# Patient Record
Sex: Male | Born: 1955 | Race: White | Hispanic: No | Marital: Married | Smoking: Former smoker
Health system: Southern US, Community
[De-identification: ages and names within clinical notes are randomized; demographics above are authoritative.]

## PROBLEM LIST (undated history)

## (undated) DIAGNOSIS — N200 Calculus of kidney: Secondary | ICD-10-CM

## (undated) DIAGNOSIS — E785 Hyperlipidemia, unspecified: Secondary | ICD-10-CM

## (undated) DIAGNOSIS — I2510 Atherosclerotic heart disease of native coronary artery without angina pectoris: Secondary | ICD-10-CM

## (undated) DIAGNOSIS — I10 Essential (primary) hypertension: Secondary | ICD-10-CM

## (undated) HISTORY — DX: Atherosclerotic heart disease of native coronary artery without angina pectoris: I25.10

## (undated) HISTORY — DX: Essential (primary) hypertension: I10

## (undated) HISTORY — DX: Hyperlipidemia, unspecified: E78.5

## (undated) HISTORY — DX: Calculus of kidney: N20.0

## (undated) HISTORY — PX: COLONOSCOPY: SHX174

---

## 2002-02-27 LAB — CONVERTED CEMR LAB
HDL: 52 mg/dL
LDL Cholesterol: 87 mg/dL
Triglycerides: 80 mg/dL

## 2006-11-22 ENCOUNTER — Ambulatory Visit: Payer: Self-pay | Admitting: Family Medicine

## 2006-11-22 DIAGNOSIS — R030 Elevated blood-pressure reading, without diagnosis of hypertension: Secondary | ICD-10-CM | POA: Insufficient documentation

## 2006-11-23 ENCOUNTER — Encounter: Payer: Self-pay | Admitting: Family Medicine

## 2006-11-26 ENCOUNTER — Encounter: Payer: Self-pay | Admitting: Family Medicine

## 2006-11-26 LAB — CONVERTED CEMR LAB
BUN: 20 mg/dL (ref 6–23)
Chloride: 104 meq/L (ref 96–112)
Cholesterol: 172 mg/dL (ref 0–200)
HDL: 44 mg/dL (ref >39)
LDL Cholesterol: 112 mg/dL — ABNORMAL HIGH (ref 0–99)
Potassium: 4.3 meq/L (ref 3.5–5.3)
Triglycerides: 79 mg/dL (ref <150)
VLDL: 16 mg/dL (ref 0–40)

## 2009-06-18 ENCOUNTER — Encounter: Payer: Self-pay | Admitting: Family Medicine

## 2009-09-25 DIAGNOSIS — I219 Acute myocardial infarction, unspecified: Secondary | ICD-10-CM

## 2009-09-25 HISTORY — PX: CARDIAC CATHETERIZATION: SHX172

## 2009-09-25 HISTORY — DX: Acute myocardial infarction, unspecified: I21.9

## 2010-07-19 ENCOUNTER — Telehealth (INDEPENDENT_AMBULATORY_CARE_PROVIDER_SITE_OTHER): Payer: Self-pay | Admitting: *Deleted

## 2010-07-19 ENCOUNTER — Ambulatory Visit: Payer: Self-pay | Admitting: Family Medicine

## 2010-07-19 DIAGNOSIS — I10 Essential (primary) hypertension: Secondary | ICD-10-CM | POA: Insufficient documentation

## 2010-07-19 LAB — CONVERTED CEMR LAB
ALT: 14 units/L
AST: 16 units/L
Alkaline Phosphatase: 54 units/L
Cholesterol: 200 mg/dL
GFR calc Af Amer: 99 mL/min
GFR calc non Af Amer: 85 mL/min
LDL Cholesterol: 126 mg/dL
Total CHOL/HDL Ratio: 3.9
Triglycerides: 116 mg/dL

## 2010-07-20 ENCOUNTER — Telehealth: Payer: Self-pay | Admitting: Family Medicine

## 2010-07-20 DIAGNOSIS — I2510 Atherosclerotic heart disease of native coronary artery without angina pectoris: Secondary | ICD-10-CM | POA: Insufficient documentation

## 2010-07-25 ENCOUNTER — Encounter: Payer: Self-pay | Admitting: Family Medicine

## 2010-07-25 ENCOUNTER — Telehealth: Payer: Self-pay | Admitting: Family Medicine

## 2010-07-25 LAB — CONVERTED CEMR LAB: ALT: 15 units/L

## 2010-07-26 ENCOUNTER — Encounter: Payer: Self-pay | Admitting: Family Medicine

## 2010-07-28 ENCOUNTER — Encounter: Payer: Self-pay | Admitting: Family Medicine

## 2010-07-28 LAB — CONVERTED CEMR LAB
Albumin: 4.8 g/dL
Calcium: 9.6 mg/dL

## 2010-08-16 ENCOUNTER — Encounter: Payer: Self-pay | Admitting: Family Medicine

## 2010-09-08 ENCOUNTER — Encounter: Payer: Self-pay | Admitting: Family Medicine

## 2010-09-08 DIAGNOSIS — I252 Old myocardial infarction: Secondary | ICD-10-CM

## 2010-09-14 ENCOUNTER — Ambulatory Visit: Payer: Self-pay | Admitting: Family Medicine

## 2010-09-16 ENCOUNTER — Encounter: Payer: Self-pay | Admitting: Family Medicine

## 2010-09-16 LAB — CONVERTED CEMR LAB
LDL Cholesterol: 58 mg/dL
Total CHOL/HDL Ratio: 58

## 2010-10-05 ENCOUNTER — Encounter: Payer: Self-pay | Admitting: Family Medicine

## 2010-10-25 NOTE — Progress Notes (Signed)
Summary: Lab results  Phone Note Outgoing Call   Summary of Call: Call pt: glucose in teh pre-diabetic range. We will follow and recheck in 6 months. Thyroid and prostate blood test are normal. Liver and kidneys are great. Chol is up a little. LDL is 126 (should be under 100). Work on diet and exercise. Recheck in one year.  Initial call taken by: Nani Gasser MD,  July 25, 2010 12:01 PM  Follow-up for Phone Call        called and notified pt of results and reccomendations.FYI: pt is at the ER now because he was having chest pain,nausea and sweating.Will get D/C summary from [facility] in a few days Follow-up by: Avon Gully CMA, Duncan Dull),  July 25, 2010 2:34 PM

## 2010-10-25 NOTE — Assessment & Plan Note (Signed)
Summary: CPE, HTN   Vital Signs:  Patient profile:   55 year old male Height:      71.5 inches Weight:      191 pounds BMI:     26.36 Pulse rate:   98 / minute BP sitting:   142 / 92  (right arm) Cuff size:   regular  Vitals Entered By: Avon Gully CMA, Duncan Dull) (July 19, 2010 9:42 AM) CC: CPE   Primary Care Provider:  Linford Arnold, C  CC:  CPE.  History of Present Illness: Has noticed if More HA over the last 10 years.  Using uses BC goody powder. Sees that his BP is high and wonders if this could be related.  + fam hx of heart dz and of HTN.   Current Medications (verified): 1)  None  Allergies (verified): No Known Drug Allergies  Comments:  Nurse/Medical Assistant: The patient's medications and allergies were reviewed with the patient and were updated in the Medication and Allergy Lists. 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  Married to [employer] with 2 adult children.    Former Smoker Alcohol use-no Drug use-no Regular exercise-no  Review of Systems       The patient complains of chest pain.  The patient denies anorexia, fever, weight loss, weight gain, vision loss, decreased hearing, hoarseness, syncope, dyspnea on exertion, peripheral edema, prolonged cough, headaches, hemoptysis, abdominal pain, melena, hematochezia, severe indigestion/heartburn, hematuria, incontinence, genital sores, muscle weakness, suspicious skin lesions, transient blindness, difficulty walking, depression, unusual weight change, abnormal bleeding, enlarged lymph nodes, and breast masses.         Says get occ twinge in his chest. Can be anywhere and last for seconds. It is random and not particularly bohtersome. No assoc with SOB , etc. Can be at rest or acitivity.    Physical Exam  General:  Well-developed,well-nourished,in no acute distress; alert,appropriate and cooperative throughout examination Head:  Normocephalic and atraumatic without obvious abnormalities. No apparent alopecia or balding. Eyes:  No corneal or conjunctival inflammation noted. EOMI. Perrla.  Ears:  External ear exam shows no significant lesions or deformities.  Otoscopic examination reveals clear canals, tympanic membranes are intact bilaterally without bulging, retraction, inflammation or discharge. Hearing is grossly normal bilaterally. Nose:  External nasal examination shows no deformity or inflammation. Nasal mucosa are pink and moist without lesions or exudates. Mouth:  Oral mucosa and oropharynx without lesions or exudates.  Teeth in good repair. Neck:  No deformities, masses, or tenderness noted. Chest Wall:  No deformities, masses, tenderness or gynecomastia noted. Lungs:  Normal respiratory effort, chest expands symmetrically. Lungs are clear to auscultation, no crackles or wheezes. Heart:  Normal rate and regular rhythm. S1 and S2 normal without gallop, murmur, click, rub or other extra sounds. Abdomen:  Bowel sounds positive,abdomen soft and non-tender without masses, organomegaly or hernias noted. Rectal:  no external abnormalities, no hemorrhoids, normal sphincter tone, and no masses.   Prostate:  no nodules, no asymmetry, and 1+ enlarged.   Msk:  No deformity or scoliosis noted of thoracic or lumbar spine.   Pulses:  R and L carotid,radial,dorsalis pedis and posterior tibial pulses are full and equal bilaterally Extremities:  No clubbing, cyanosis, edema, or deformity noted with normal full range of motion of all joints.  Neurologic:  No cranial nerve deficits noted. Station and gait are normal.DTRs are symmetrical throughout. Sensory, motor and coordinative functions appear intact. Skin:  no rashes.   Cervical Nodes:  No lymphadenopathy noted Psych:  Cognition and  judgment appear intact. Alert and cooperative with normal attention span and concentration. No apparent delusions, illusions, hallucinations   Impression & Recommendations:  Problem # 1:  HEALTH MAINTENANCE EXAM (ICD-V70.0) Exam is normal.  Dsicussed consider treadmill stress testing since his fathe died from premature heart dz.   Wil check chol as well to better risk stratify him.  We aldo need to get his BP under control.  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 He really has a healthy BMI. Low salt diet and start medication. Discussed potential SE. f/u in 1 month.  His updated medication list for this problem includes:    Lisinopril 5 Mg Tabs (Lisinopril) .Marland Kitchen... Take 1 tablet by mouth once a day in am.  BP today: 142/92 Prior BP: 118/82 (11/22/2006)  Labs Reviewed: K+: 4.3 (11/23/2006) Creat: : 1.10 (11/23/2006)   Chol: 172 (11/23/2006)   HDL: 44 (11/23/2006)   LDL: 112 (11/23/2006)   TG: 79 (11/23/2006)  Complete Medication List: 1)  Lisinopril 5 Mg Tabs (Lisinopril) .... Take 1 tablet by mouth once a day in am.   Patient Instructions: 1)  It is important that you exercise reguarly at least 20 minutes 5 times a week. If you develop chest pain, have severe difficulty breathing, or feel very tired, stop exercising immediately and seek medical attention.  2)  We will call you with your lab results.  3)  Please schedule a follow-up appointment in 1 month.   Contraindications/Deferment of Procedures/Staging:    Test/Procedure: FLU VAX    Reason for deferment: patient declined  Prescriptions: LISINOPRIL 5 MG TABS (LISINOPRIL) Take 1 tablet by mouth once a day in AM.  #30 x 0   Entered and Authorized by:   Nani Gasser MD   Signed by:   Nani Gasser MD on 07/19/2010   Method used:   Electronically to         [facility] [id number][facility] (retail)       [address] [place], [place]  [address]       Ph: [phone] or [phone]       Fax: [phone]   RxID:   [id number]    Orders Added: 1)  T-Comprehensive Metabolic Panel [[id number]] 2)  T-Lipid Profile [[id number]] 3)  T-TSH [[id number]] 4)  T-PSA [[id number]] 5)  Est. Patient age 65-64 [54] 6)  Est. Patient Level II [58527]

## 2010-10-25 NOTE — Letter (Signed)
Summary: [facility]  [facility]   Imported By: Maryln Gottron 09/01/2010 14:46:08  _____________________________________________________________________  External Attachment:    Type:   Image     Comment:   External Document

## 2010-10-25 NOTE — Cardiovascular Report (Signed)
Summary: [facility]  [facility]   Imported By: Lanelle Bal 08/03/2010 10:10:09  _____________________________________________________________________  External Attachment:    Type:   Image     Comment:   External Document

## 2010-10-25 NOTE — Progress Notes (Signed)
  Phone Note Call from Patient   Caller: Patient Call For: Nani Gasser MD Summary of Call: pt called and states she does want to get the stress test done preferably in [place] on mon any time or tues am Initial call taken by: Avon Gully CMA, Duncan Dull),  July 20, 2010 10:07 AM  Follow-up for Phone Call        will schedule Follow-up by: Kathlene November LPN,  July 20, 2010 10:48 AM  New Problems: CHEST PAIN, ATYPICAL (ICD-786.59) ISCHEMIC HEART DISEASE, PREMATURE, FAMILY HX (ICD-V17.3)   New Problems: CHEST PAIN, ATYPICAL (ICD-786.59) ISCHEMIC HEART DISEASE, PREMATURE, FAMILY HX (ICD-V17.3)

## 2010-10-25 NOTE — Progress Notes (Signed)
----   Converted from flag ---- ---- 07/19/2010 12:34 PM, Nani Gasser MD wrote: Pls call pt: Let him know needs f/u in one month for BP. I accidenteally put 1 year on his check out sheet. Also tell him if wants referral for stress test to let me know. ------------------------------  07/19/10 3:10 acm. called and left pt a message with above info

## 2010-10-25 NOTE — Letter (Signed)
Summary: [facility]  [facility]   Imported By: Lanelle Bal 08/03/2010 10:10:57  _____________________________________________________________________  External Attachment:    Type:   Image     Comment:   External Document

## 2010-10-27 NOTE — Miscellaneous (Signed)
  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

## 2010-10-27 NOTE — Assessment & Plan Note (Signed)
Summary: F/U MI   Vital Signs:  Patient profile:   55 year old male Height:      71.5 inches Weight:      188 pounds Pulse rate:   54 / minute BP sitting:   123 / 76  (right arm) Cuff size:   regular  Vitals Entered By: Avon Gully CMA, Duncan Dull) (September 14, 2010 9:06 AM) CC: f/u heart attack   Primary Care Provider:  Linford Arnold, C  CC:  f/u heart attack.  History of Present Illness: Last saw pt and we were trying to schedule his for a stress test but went to ED for chest pain before we got hthis scheduled. He did have an MI.  No stents.  ON medications.  Following with cardiology. He was  on Plavix for 6 weeks post MI.  He has f/u in 6 months.  He brought in record of his BPS.  These have mostly bee in the 115/60s which is fantastic. He has started an exercis routine.   He want to know where his lipid are. Needs copy sent ot the cardiologist.  Has lost about 3 lbs.  He is execising.  Has been watchinghis diet. Says he is schedule to return to work in 2 weeks.   Current Medications (verified): 1)  Lisinopril 5 Mg Tabs (Lisinopril) .... Take 1 Tablet By Mouth Once A Day in Am. 2)  Metoprolol Tartrate 25 Mg Tabs (Metoprolol Tartrate) .... Take One Tablet By Mouth in The Morning 3)  Aspirin 325 Mg Tabs (Aspirin) .... Take One Tablet By Mouth Once A Day 4)  Pravastatin Sodium 40 Mg Tabs (Pravastatin Sodium) .... Take One Tablet By Mouth One By Mouth Once Daily 5)  Nitrostat 0.4 Mg Subl (Nitroglycerin) .... Take As Needed For Chest Pain  Allergies (verified): No Known Drug Allergies  Comments:  Nurse/Medical Assistant: The patient's medications and allergies were reviewed with the patient and were updated in the Medication and Allergy Lists. Avon Gully CMA, Duncan Dull) (September 14, 2010 9:11 AM)  Past History:  Past Medical History: MI  in 07/2010  Physical Exam  General:  Well-developed,well-nourished,in no acute distress; alert,appropriate and cooperative throughout  examination Head:  Normocephalic and atraumatic without obvious abnormalities. No apparent alopecia or balding. Lungs:  Normal respiratory effort, chest expands symmetrically. Lungs are clear to auscultation, no crackles or wheezes. Heart:  Normal rate and regular rhythm. S1 and S2 normal without gallop, murmur, click, rub or other extra sounds. NO carotid bruits.  Pulses:  RAdila 2+  Extremities:  No LE edema.    Impression & Recommendations:  Problem # 1:  CAD (ICD-414.00) Dsicussed that he is doing really well. He does feel dizzy when take whole tab of metoprolol so rcommend 1/2 tab by mouth two times a day  and see if this helps or can move his ACEi to evening and see if that helps. BPs look great. 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 I encouraged him to do some pre-planning with meal and try to eat more healthy options and also to try to keep up his exercis which he has started. We will check lilipids this week. H e is not fasting today.  Please see hosp d/c summary and notes from Cardiology F/U in one mont after he returns to work to recheck BP.  His updated medication list for this problem includes:    Lisinopril 5 Mg Tabs (Lisinopril) .Marland Kitchen... Take 1  tablet by mouth once a day in am.    Metoprolol Tartrate 25 Mg Tabs (Metoprolol tartrate) .Marland Kitchen... Take one tablet by mouth in the morning    Aspirin 325 Mg Tabs (Aspirin) .Marland Kitchen... Take one tablet by mouth once a day    Nitrostat 0.4 Mg Subl (Nitroglycerin) .Marland Kitchen... Take as needed for chest pain  Complete Medication List: 1)  Lisinopril 5 Mg Tabs (Lisinopril) .... Take 1 tablet by mouth once a day in am. 2)  Metoprolol Tartrate 25 Mg Tabs (Metoprolol tartrate) .... Take one tablet by mouth in the morning 3)  Aspirin 325 Mg Tabs (Aspirin) .... Take one tablet by mouth once a day 4)  Pravastatin Sodium 40 Mg Tabs (Pravastatin sodium) .... Take one tablet by mouth one by mouth once daily 5)   Nitrostat 0.4 Mg Subl (Nitroglycerin) .... Take as needed for chest pain  Other Orders: T-Comprehensive Metabolic Panel [id number]) T-Lipid Profile ([id number])  Patient Instructions: 1)  Follow up at the end of January for bloodpressure 2)  We will call you with your lab results.    Orders Added: 1)  T-Comprehensive Metabolic Panel [[id number]] 2)  T-Lipid Profile [[id number]] 3)  Est. Patient Level IV [75643]  Appended Document: F/U MI He has had not CP or SOB since he has been home.   Appended Document: F/U MI Call pt: Cholesterol is OK and labs look OK. Continue current regimen. December 28, 20111:01 PM Metheney MD, [facility]   11:49 AM September 22, 2010 McCrimmon CMA, Duncan Dull), Sue Lush called and left message on pt's vm

## 2010-10-27 NOTE — Consult Note (Signed)
Summary: [facility]  [facility]   Imported By: Lanelle Bal 09/21/2010 12:04:00  _____________________________________________________________________  External Attachment:    Type:   Image     Comment:   External Document

## 2010-10-31 ENCOUNTER — Encounter: Payer: Self-pay | Admitting: Family Medicine

## 2010-10-31 ENCOUNTER — Ambulatory Visit (INDEPENDENT_AMBULATORY_CARE_PROVIDER_SITE_OTHER): Payer: BC Managed Care – PPO | Admitting: Family Medicine

## 2010-10-31 DIAGNOSIS — I2510 Atherosclerotic heart disease of native coronary artery without angina pectoris: Secondary | ICD-10-CM

## 2010-10-31 DIAGNOSIS — I10 Essential (primary) hypertension: Secondary | ICD-10-CM

## 2010-11-10 NOTE — Assessment & Plan Note (Signed)
Summary: HTN, CAD   Vital Signs:  Patient profile:   55 year old male Height:      71.5 inches Weight:      183 pounds Pulse rate:   99 / minute BP sitting:   131 / 88  (right arm) Cuff size:   regular  Vitals Entered By: Avon Gully CMA, (AAMA) (October 31, 2010 9:12 AM) CC: f/u BP   Primary Care Provider:  Linford Arnold, C  CC:  f/u BP.  History of Present Illness: Saw the cardilogist and told him to take his metoprolol in the AM and then the lisinopril at bedtime. Then was forgetting to take the evening ACE so has moved it to AM and now his dizziness has resolved. He schedule is constantly changing.  Did have some coffe this AM and pizza last night and thinks this may be why BP is elevated.   Current Medications (verified): 1)  Lisinopril 5 Mg Tabs (Lisinopril) .... Take 1 Tablet By Mouth Once A Day in Am. 2)  Metoprolol Tartrate 25 Mg Tabs (Metoprolol Tartrate) .... Take One Tablet By Mouth in The Morning 3)  Aspirin 325 Mg Tabs (Aspirin) .... Take One Tablet By Mouth Once A Day 4)  Pravastatin Sodium 40 Mg Tabs (Pravastatin Sodium) .... Take One Tablet By Mouth One By Mouth Once Daily 5)  Nitrostat 0.4 Mg Subl (Nitroglycerin) .... Take As Needed For Chest Pain  Allergies (verified): No Known Drug Allergies  Comments:  Nurse/Medical Assistant: The patient's medications and allergies were reviewed with the patient and were updated in the Medication and Allergy Lists. Avon Gully CMA, Duncan Dull) (October 31, 2010 9:12 AM)  Physical Exam  General:  Well-developed,well-nourished,in no acute distress; alert,appropriate and cooperative throughout examination Head:  Normocephalic and atraumatic without obvious abnormalities. No apparent alopecia or balding. Lungs:  Normal respiratory effort, chest expands symmetrically. Lungs are clear to auscultation, no crackles or wheezes. Heart:  Normal rate and regular rhythm. S1 and S2 normal without gallop, murmur, click, rub or  other extra sounds. No carotid bruts.  Skin:  no rashes.   Psych:  Cognition and judgment appear intact. Alert and cooperative with normal attention span and concentration. 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 BP at goal. Up a little from coffee today. Recheck in 2 months.  His updated medication list for this problem includes:    Lisinopril 5 Mg Tabs (Lisinopril) .Marland Kitchen... Take 1 tablet by mouth once a day in am.    Metoprolol Tartrate 25 Mg Tabs (Metoprolol tartrate) .Marland Kitchen... Take one tablet by mouth in the morning  BP today: 131/88 Prior BP: 123/76 (09/14/2010)  Labs Reviewed: K+: 4.3 (11/23/2006) Creat: : 1.01 (07/19/2010)   Chol: 47 (09/16/2010)   HDL: 66 (09/16/2010)   LDL: 58 (09/16/2010)   TG: 66 (09/16/2010)  Orders: T-Comprehensive Metabolic Panel ([id number]) T-Lipid Profile ([id number])  Problem # 2:  CAD (ICD-414.00) He is doing wel. Receck potassiu, Cr and liver in April.  His updated medication list for this problem includes:    Lisinopril 5 Mg Tabs (Lisinopril) .Marland Kitchen... Take 1 tablet by mouth once a day in am.    Metoprolol Tartrate 25 Mg Tabs (Metoprolol tartrate) .Marland Kitchen... Take one tablet by mouth in the morning    Aspirin 325 Mg Tabs (Aspirin) .Marland Kitchen... Take one tablet by mouth once a day    Nitrostat 0.4 Mg Subl (Nitroglycerin) .Marland Kitchen... Take as needed for  chest pain  Orders: T-Comprehensive Metabolic Panel [id number]) T-Lipid Profile ([id number])  Complete Medication List: 1)  Lisinopril 5 Mg Tabs (Lisinopril) .... Take 1 tablet by mouth once a day in am. 2)  Metoprolol Tartrate 25 Mg Tabs (Metoprolol tartrate) .... Take one tablet by mouth in the morning 3)  Aspirin 325 Mg Tabs (Aspirin) .... Take one tablet by mouth once a day 4)  Pravastatin Sodium 40 Mg Tabs (Pravastatin sodium) .... Take one tablet by mouth one by mouth once daily 5)  Nitrostat 0.4 Mg  Subl (Nitroglycerin) .... Take as needed for chest pain  Patient Instructions: 1)  Follow up end of April or early May for BP check and go over lab findings.    Orders Added: 1)  T-Comprehensive Metabolic Panel [[id number]] 2)  Est. Patient Level III [29562] 3)  T-Lipid Profile [[id number]]

## 2011-01-30 ENCOUNTER — Encounter: Payer: Self-pay | Admitting: Family Medicine

## 2011-01-30 ENCOUNTER — Ambulatory Visit (INDEPENDENT_AMBULATORY_CARE_PROVIDER_SITE_OTHER): Payer: BC Managed Care – PPO | Admitting: Family Medicine

## 2011-01-30 VITALS — BP 118/80 | HR 54 | Ht 71.0 in | Wt 179.0 lb

## 2011-01-30 DIAGNOSIS — I2510 Atherosclerotic heart disease of native coronary artery without angina pectoris: Secondary | ICD-10-CM

## 2011-01-30 NOTE — Progress Notes (Signed)
  Subjective:    Patient ID: Darrell Bridges, male    DOB: Jan 04, 1956, 55 y.o.   MRN: [id number]  HPI Here to discuss his meds and his lab results.  Had last done 7 days ago at [facility].   Occ has right lower back pain near his kidney area. Says had a hx of kidney stones.  The metoprolol can could lower back and side pain. This has been mild but not severe like a kidney stones. Did stop taking the ASA and chol pill for 3 days to see if it helped.  Then restarted those. No blood in the urine.  Does get muscle cramps in the toes.  Has been exercising more.  Has contributed it to his exercise. Says notices it at night when lying in bed. The pain went away after 3 days and he is feeling well.  He has noticed his HR dropping into the 40s. Says he doesn't feel bad but says it often is a little while after his workout/exercise.  After resting for a few mintues. Darrell Bridges is his cardiologist and he has a f/u in about a month with him.   We reviewed his complete metabolic panel. It was normal except for an elevated glucose but he was not fasting at the time. We have not asked to check a lipid panel.  Review of Systems     Objective:   Physical Exam  Constitutional: He appears well-developed and well-nourished.  HENT:  Head: Normocephalic and atraumatic.  Neck: Neck supple. No thyromegaly present.  Cardiovascular: Normal rate, regular rhythm and normal heart sounds.   Pulmonary/Chest: Effort normal and breath sounds normal.  Lymphadenopathy:    He has no cervical adenopathy.          Assessment & Plan:  CAD - doing well with his medications. We will get a screening lipid panel today as he is fasting. Continue current regimen.  HR in the 40s- will try to call and speak with Darrell Bridges about this. He is not persistently in the 140s but certainly on multiple occasions has dropped into the 40s. He is starting on the lowest dose of beta blocker so not sure he has a lot of options but I can  certainly speak with Darrell Bridges and see what he recommends. Right flank pain-unclear etiology. I don't think this is a stone as his pain was not severe enough. I also explained to him I don't think this is stat 10. He is also worried about beta blocker as this is a potential side effect listed on the box. I explained to him that I think this is unlikely and the fact that his pain has resolved given that he is continuing the medication support which is probably not his beta blocker.

## 2011-01-31 ENCOUNTER — Telehealth: Payer: Self-pay | Admitting: Family Medicine

## 2011-01-31 LAB — LIPID PANEL
HDL: 44 mg/dL (ref >39)
Total CHOL/HDL Ratio: 2.8 Ratio

## 2011-01-31 NOTE — Telephone Encounter (Signed)
Left message on home and cell with results and reccomendation

## 2011-01-31 NOTE — Telephone Encounter (Signed)
Call patient: Cholesterol looks good but continue the current dose of the statin.

## 2011-02-11 ENCOUNTER — Other Ambulatory Visit: Payer: Self-pay | Admitting: Family Medicine

## 2011-02-15 ENCOUNTER — Encounter: Payer: Self-pay | Admitting: Family Medicine

## 2011-03-06 ENCOUNTER — Telehealth: Payer: Self-pay | Admitting: Family Medicine

## 2011-03-06 NOTE — Telephone Encounter (Signed)
LMOM advising pt to return our call ZO:XWRU of card

## 2011-03-06 NOTE — Telephone Encounter (Signed)
Pt called and said we left him a message that we needed to know the name of his cardiologist.  He called back and left the name which is:  Dr. Marlana Salvage.  I think this is [facility]. Plan:  Routed to Dr. Marlyne Beards, LPN Domingo Dimes

## 2011-03-08 ENCOUNTER — Telehealth: Payer: Self-pay | Admitting: Family Medicine

## 2011-03-08 NOTE — Telephone Encounter (Signed)
Called and LM on cell phone that I spoke with Dr. Mayme Genta at [facility] and he said as long as not feeling symptomatic with HR in the 40-50s that it is OK to continue it.

## 2011-03-08 NOTE — Telephone Encounter (Signed)
LMOM for pt letting him know that Dr. Linford Arnold had called Dr. Mayme Genta and spoke to him and he said HR in the 40's and 50's as long as not symptomatic should be fine.  Ok to continue whatever was discussed.

## 2011-05-01 ENCOUNTER — Ambulatory Visit (INDEPENDENT_AMBULATORY_CARE_PROVIDER_SITE_OTHER): Payer: BC Managed Care – PPO | Admitting: Family Medicine

## 2011-05-01 ENCOUNTER — Encounter: Payer: Self-pay | Admitting: Family Medicine

## 2011-05-01 VITALS — BP 128/82 | HR 54 | Ht 71.0 in | Wt 181.0 lb

## 2011-05-01 DIAGNOSIS — R002 Palpitations: Secondary | ICD-10-CM

## 2011-05-01 LAB — CBC WITH DIFFERENTIAL/PLATELET
HCT: 41.7 % (ref 39.0–52.0)
Hemoglobin: 13.6 g/dL (ref 13.0–17.0)
Lymphocytes Relative: 30 % (ref 12–46)
Lymphs Abs: 1.3 10*3/uL (ref 0.7–4.0)
Monocytes Absolute: 0.4 10*3/uL (ref 0.1–1.0)
Monocytes Relative: 8 % (ref 3–12)
Neutro Abs: 2.5 10*3/uL (ref 1.7–7.7)
RBC: 4.25 MIL/uL (ref 4.22–5.81)
WBC: 4.4 10*3/uL (ref 4.0–10.5)

## 2011-05-01 NOTE — Progress Notes (Signed)
  Subjective:    Patient ID: Darrell Bridges, male    DOB: 05-01-1956, 55 y.o.   MRN: [id number]  HPI Sensation of irreg heartbeat started in FEb. Then happened again about 30 days later happened again. Says usually OK in the AM but when goes to lay down it feels like his heart is "flopping".  He tries to relax an dtake deep breath then go to sleep.  When he wakes up he feels normal.  Now happening about every other day. Had a stress test Dec 15th. Doesn't happen with exercise. Occ also experiences muscle craping in his toes.  Twin brother  Recently dx with thyroid problems. No blood in the stool. Only sleep 4-6 hours during the day. 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  No CP or SOB.    Review of Systems     BP 128/82  Pulse 54  Ht 5\' 11"  (1.803 m)  Wt 181 lb (82.101 kg)  BMI 25.24 kg/m2    No Known Allergies  Past Medical History  Diagnosis Date  . Heart attack 07/2010    No past surgical history on file.  History   Social History  . Marital Status: Single    Spouse Name: N/A    Number of Children: N/A  . Years of Education: N/A   Occupational History  . Not on file.   Social History Main Topics  . 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 Smokeless tobacco: Not on file  . Alcohol Use: No  . Drug Use: No  . Sexually Active: Yes   Other Topics Concern  . Not on file   Social History Narrative  . No narrative on file    Family History  Problem Relation Age of Onset  . Heart attack Father   . Diabetes Mother   . Hyperlipidemia    . Hypertension Mother   . Stroke      Mr. Dube does not currently have medications on file.  Objective:   Physical Exam  Constitutional: He is oriented to person, place, and time. He appears well-developed and well-nourished.  HENT:  Head: Normocephalic and atraumatic.  Cardiovascular: Normal rate, regular rhythm and normal heart sounds.   Pulmonary/Chest: Effort normal and breath sounds normal.  Neurological: He is alert and oriented to person,  place, and time.  Skin: Skin is warm and dry.  Psychiatric: He has a normal mood and affect. His behavior is normal.          Assessment & Plan:  Palpitations- Occur when laying down to rest and not with exercise.  Sxs are getting more frequent.  Will get labs to rule out thyroid d/o, electrolytes, and anemia. If normal then records a heart monitor.  He had a normal stress test last Dec 2011. He currently sees Dr. Carmelina Dane but would like to transfer his care downstairs in our building for convenience.  Will put in referral.  Certainly could be exacerbated by his lack of sleep.

## 2011-05-01 NOTE — Patient Instructions (Signed)
Can try valerian root or melatonin for sleep.

## 2011-05-02 ENCOUNTER — Telehealth: Payer: Self-pay | Admitting: Family Medicine

## 2011-05-02 LAB — COMPLETE METABOLIC PANEL WITH GFR
Alkaline Phosphatase: 44 U/L (ref 39–117)
BUN: 13 mg/dL (ref 6–23)
GFR, Est Non African American: >60 mL/min (ref >60)
Glucose, Bld: 84 mg/dL (ref 70–99)
Sodium: 140 mEq/L (ref 135–145)
Total Bilirubin: 0.7 mg/dL (ref 0.3–1.2)
Total Protein: 6.7 g/dL (ref 6.0–8.3)

## 2011-05-02 NOTE — Telephone Encounter (Signed)
Left message on vm

## 2011-05-02 NOTE — Telephone Encounter (Signed)
Call patient: Complete metabolic panel and complete blood count are normal. Thyroid looks great.

## 2011-05-08 ENCOUNTER — Ambulatory Visit (INDEPENDENT_AMBULATORY_CARE_PROVIDER_SITE_OTHER): Payer: BC Managed Care – PPO | Admitting: Family Medicine

## 2011-05-08 ENCOUNTER — Encounter: Payer: Self-pay | Admitting: Family Medicine

## 2011-05-08 DIAGNOSIS — R002 Palpitations: Secondary | ICD-10-CM

## 2011-05-08 NOTE — Progress Notes (Signed)
  Subjective:    Patient ID: Darrell Bridges, male    DOB: 10/20/1955, 55 y.o.   MRN: [id number]  HPI Here to f/u on palpitations.  Says they are still occurring.  Palpitations occur moslty when laying down to go to bed.  Doesn't happen with activity such as playing golf or yard work. 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  Says it feels like 2 beats together and then like his heart skips. Had normal stress test 08/2010. He stil wonders if this could be caused by his beta-blocker. See last OV.     Review of Systems     Objective:   Physical Exam  Constitutional: He is oriented to person, place, and time. He appears well-developed and well-nourished.  HENT:  Head: Normocephalic and atraumatic.  Cardiovascular: Normal rate, regular rhythm and normal heart sounds.   Pulmonary/Chest: Effort normal and breath sounds normal.  Neurological: He is alert and oriented to person, place, and time.  Skin: Skin is warm and dry.          Assessment & Plan:  Palpitations- REvied his lab work with him. It is normal. Discussed I really think he needs to see cardiology and possibly have a cardiac monitor to try to capture the events. Explained could be benign PVCs for example.  Thought this needs to be determine to rule out an arrythmia. Will schedule with Dr. Jens Som for further evaluation of his sxs.

## 2011-05-23 ENCOUNTER — Encounter: Payer: Self-pay | Admitting: Cardiology

## 2011-05-24 ENCOUNTER — Ambulatory Visit (INDEPENDENT_AMBULATORY_CARE_PROVIDER_SITE_OTHER): Payer: BC Managed Care – PPO | Admitting: Cardiology

## 2011-05-24 ENCOUNTER — Encounter: Payer: Self-pay | Admitting: Cardiology

## 2011-05-24 DIAGNOSIS — I2510 Atherosclerotic heart disease of native coronary artery without angina pectoris: Secondary | ICD-10-CM

## 2011-05-24 DIAGNOSIS — E785 Hyperlipidemia, unspecified: Secondary | ICD-10-CM

## 2011-05-24 DIAGNOSIS — R002 Palpitations: Secondary | ICD-10-CM | POA: Insufficient documentation

## 2011-05-24 DIAGNOSIS — I10 Essential (primary) hypertension: Secondary | ICD-10-CM

## 2011-05-24 MED ORDER — METOPROLOL SUCCINATE ER 25 MG PO TB24: ORAL_TABLET | ORAL | Status: DC

## 2011-05-24 MED ORDER — METOPROLOL SUCCINATE ER 25 MG PO TB24: 25.0000 mg | ORAL_TABLET | Freq: Every day | ORAL | Status: DC

## 2011-05-24 NOTE — Progress Notes (Signed)
Addended by: Freddi Starr on: 05/24/2011 03:53 PM   Modules accepted: Orders

## 2011-05-24 NOTE — Progress Notes (Signed)
Pt called back to report he is already taking toprol. Per dr Jens Som we will increase to toprol 25mg  in the am and 12.5mg  6-8 hours later. Pt made aware Deliah Goody

## 2011-05-24 NOTE — Assessment & Plan Note (Signed)
Continue aspirin, statin, beta blocker and ACE inhibitor. Continue risk factor modification.

## 2011-05-24 NOTE — Patient Instructions (Addendum)
Your physician recommends that you schedule a follow-up appointment in: 3 months with a treadmill  STOP LOPRESSOR  START METOPROLOL SUCC 25 MG ONCE DAILY

## 2011-05-24 NOTE — Assessment & Plan Note (Signed)
Continue statin. 

## 2011-05-24 NOTE — Assessment & Plan Note (Signed)
Patient is describing what sounds to be PVCs. Discontinue short-acting Lopressor and begin Toprol 25 mg daily. If symptoms persist will consider monitor. I have explained that it can be he is having PVCs these are benign in the setting of normal LV function.

## 2011-05-24 NOTE — Assessment & Plan Note (Signed)
Blood pressure controlled. Continue present medications. 

## 2011-05-24 NOTE — Progress Notes (Signed)
HPI: 55 year old male with coronary disease for establishment. Patient had a myocardial infarction in October of 2011. He had a cardiac catheterization in [place] that showed an ejection fraction of 65% and normal wall motion. There was a 80-90% distal circumflex and medical therapy was recommended. There is otherwise a 30% LAD and a 20% proximal circumflex. There was a 25-30% left main. He had a stress echocardiogram in December of 2011 that apparently was normal. He denies dyspnea on exertion, orthopnea, PND, pedal edema, exertional chest pain or syncope. He does occasionally have palpitations. They are described as a skip. They are not sustained.  Current Outpatient Prescriptions  Medication Sig Dispense Refill  . aspirin 325 MG EC tablet Take 325 mg by mouth daily.        Marland Kitchen lisinopril (PRINIVIL,ZESTRIL) 5 MG tablet TAKE 1 TABLET EVERY MORNING  30 tablet  3  . metoprolol tartrate (LOPRESSOR) 25 MG tablet Take 25 mg by mouth 1 day or 1 dose.       . nitroGLYCERIN (NITROSTAT) 0.4 MG SL tablet Place 0.4 mg under the tongue every 5 (five) minutes as needed.        . pravastatin (PRAVACHOL) 40 MG tablet Take 40 mg by mouth daily.          No Known Allergies  Past Medical History  Diagnosis Date  . Hypertension   . Hyperlipidemia   . Nephrolithiasis   . CAD (coronary artery disease)     No past surgical history on file.  History   Social History  . Marital Status: Single    Spouse Name: N/A    Number of Children: 2  . Years of Education: N/A   Occupational History  .  [employer]   Social History Main Topics  . 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 Smokeless tobacco: Not on file  . Alcohol Use: Yes     Rarely  . Drug Use: No  . Sexually Active: Yes   Other Topics Concern  . Not on file   Social History Narrative  . No narrative on file    Family History  Problem Relation Age of Onset  . Heart attack Father     Died at 10 of MI  . Diabetes Mother   .  Hyperlipidemia    . Hypertension Mother   . Stroke      ROS: no fevers or chills, productive cough, hemoptysis, dysphasia, odynophagia, melena, hematochezia, dysuria, hematuria, rash, seizure activity, orthopnea, PND, pedal edema, claudication. Remaining systems are negative.  Physical Exam: General:  Well developed/well nourished in NAD Skin warm/dry Patient not depressed No peripheral clubbing Back-normal HEENT-normal/normal eyelids Neck supple/normal carotid upstroke bilaterally; no bruits; no JVD; no thyromegaly chest - CTA/ normal expansion CV - RRR/normal S1 and S2; no murmurs, rubs or gallops;  PMI nondisplaced Abdomen -NT/ND, no HSM, no mass, + bowel sounds, no bruit 2+ femoral pulses, no bruits Ext-no edema, chords, 2+ DP Neuro-grossly nonfocal  ECG Sinus bradycardia; no ST changes

## 2011-08-04 ENCOUNTER — Encounter: Payer: Self-pay | Admitting: Family Medicine

## 2011-08-04 ENCOUNTER — Ambulatory Visit (INDEPENDENT_AMBULATORY_CARE_PROVIDER_SITE_OTHER): Payer: BC Managed Care – PPO | Admitting: Family Medicine

## 2011-08-04 DIAGNOSIS — K921 Melena: Secondary | ICD-10-CM

## 2011-08-04 DIAGNOSIS — I10 Essential (primary) hypertension: Secondary | ICD-10-CM

## 2011-08-04 DIAGNOSIS — Z23 Encounter for immunization: Secondary | ICD-10-CM

## 2011-08-04 MED ORDER — LISINOPRIL 10 MG PO TABS: 10.0000 mg | ORAL_TABLET | Freq: Every day | ORAL | Status: DC

## 2011-08-04 NOTE — Progress Notes (Signed)
  Subjective:    Patient ID: Darrell Bridges, male    DOB: 1956-01-03, 55 y.o.   MRN: [id number]  HPI A week ago was constipated and had to strain and saw blood after bowel movement.  Blood has been bright red. No pain with BM. No abdominal cramping. Says has starting try to inc his water. Last colonoscopy was 2 years ago andwas normal. He found the report at home. He read online to be seen by MD if blood in stool so came into. Had a more normal BM today without blood. He also started metamucil this week.    Review of Systems     Objective:   Physical Exam  Constitutional: He is oriented to person, place, and time. He appears well-developed and well-nourished.  Genitourinary: Rectum normal. Guaiac negative stool.       Normal sized prostate. No masses or lesions. No external hermorrhoids or fissure.  Normal sphincter tone. Guaiac neg.   Neurological: He is alert and oriented to person, place, and time.  Psychiatric: He has a normal mood and affect. His behavior is normal.          Assessment & Plan:  Rectal bleeding. Likely from internal hemorrhoid. Continue with metamucil and inc fiber. Call if persists even with softer stools. Normal colonoscopy 2 years ago is also reassuring  Tdap given today.   HTN- since BP elevated today and las time. Inc lisinopril ot 10mg  daily. Follow up in 6 month.

## 2011-08-04 NOTE — Patient Instructions (Signed)

## 2011-09-04 ENCOUNTER — Encounter: Payer: BC Managed Care – PPO | Admitting: Cardiology

## 2011-10-16 ENCOUNTER — Other Ambulatory Visit: Payer: Self-pay | Admitting: *Deleted

## 2011-10-16 MED ORDER — LISINOPRIL 10 MG PO TABS: 10.0000 mg | ORAL_TABLET | Freq: Every day | ORAL | Status: DC

## 2011-12-14 ENCOUNTER — Encounter: Payer: Self-pay | Admitting: *Deleted

## 2011-12-18 ENCOUNTER — Telehealth: Payer: Self-pay | Admitting: *Deleted

## 2011-12-18 ENCOUNTER — Other Ambulatory Visit: Payer: Self-pay | Admitting: *Deleted

## 2011-12-18 DIAGNOSIS — E785 Hyperlipidemia, unspecified: Secondary | ICD-10-CM

## 2011-12-18 MED ORDER — METOPROLOL SUCCINATE ER 25 MG PO TB24: ORAL_TABLET | ORAL | Status: DC

## 2011-12-20 LAB — LIPID PANEL
Total CHOL/HDL Ratio: 2.9 Ratio
VLDL: 20 mg/dL (ref 0–40)

## 2011-12-21 ENCOUNTER — Encounter: Payer: Self-pay | Admitting: Family Medicine

## 2011-12-21 ENCOUNTER — Ambulatory Visit (INDEPENDENT_AMBULATORY_CARE_PROVIDER_SITE_OTHER): Payer: BC Managed Care – PPO | Admitting: Family Medicine

## 2011-12-21 VITALS — BP 109/71 | HR 48 | Ht 70.0 in | Wt 179.0 lb

## 2011-12-21 DIAGNOSIS — E785 Hyperlipidemia, unspecified: Secondary | ICD-10-CM

## 2011-12-21 DIAGNOSIS — I10 Essential (primary) hypertension: Secondary | ICD-10-CM

## 2011-12-21 MED ORDER — PRAVASTATIN SODIUM 40 MG PO TABS: 40.0000 mg | ORAL_TABLET | Freq: Every day | ORAL | Status: DC

## 2011-12-21 MED ORDER — LISINOPRIL 10 MG PO TABS: 10.0000 mg | ORAL_TABLET | Freq: Every day | ORAL | Status: DC

## 2011-12-21 MED ORDER — METOPROLOL SUCCINATE ER 25 MG PO TB24: 25.0000 mg | ORAL_TABLET | Freq: Every day | ORAL | Status: DC

## 2011-12-21 NOTE — Progress Notes (Signed)
  Subjective:    Patient ID: Darrell Bridges, male    DOB: Oct 27, 1955, 56 y.o.   MRN: [id number]  Hypertension This is a chronic problem. The current episode started more than 1 year ago. The problem is unchanged. The problem is controlled. Pertinent negatives include no blurred vision, chest pain, peripheral edema or shortness of breath. There are no associated agents to hypertension. Past treatments include ACE inhibitors and beta blockers. The current treatment provides moderate improvement. Compliance problems include exercise.    Hyperlidemia - REview labs.    Review of Systems  Eyes: Negative for blurred vision.  Respiratory: Negative for shortness of breath.   Cardiovascular: Negative for chest pain.       Objective:   Physical Exam  Constitutional: He is oriented to person, place, and time. He appears well-developed and well-nourished.  HENT:  Head: Normocephalic and atraumatic.  Cardiovascular: Normal rate, regular rhythm and normal heart sounds.   Pulmonary/Chest: Effort normal and breath sounds normal.  Neurological: He is alert and oriented to person, place, and time.  Skin: Skin is warm and dry.  Psychiatric: He has a normal mood and affect. His behavior is normal.          Assessment & Plan:  HTN - doing well. At goal.  F/U in Jan since seeing Cardiology in July.   Lipids - Reviewed labwork results.  Givne copy to take to cards with him. Continue current regime. Work on increasing exercise.   Coronary artery disease-continue aspirin, beta blocker, statin. I did discuss with him that there are some new study showing that long-term use of beta blockers for coronary artery disease may not be as beneficial as we thought. He could certainly bring this up with his cardiologist to see if he wants him to continue it or not. Right now his blood pressure looks fantastic so I made no changes to his regimen.

## 2012-02-05 ENCOUNTER — Other Ambulatory Visit: Payer: Self-pay | Admitting: Family Medicine

## 2012-04-19 ENCOUNTER — Other Ambulatory Visit: Payer: Self-pay | Admitting: Family Medicine

## 2012-07-27 ENCOUNTER — Other Ambulatory Visit: Payer: Self-pay | Admitting: Family Medicine

## 2012-08-02 ENCOUNTER — Other Ambulatory Visit: Payer: Self-pay | Admitting: Family Medicine

## 2012-08-20 ENCOUNTER — Other Ambulatory Visit: Payer: Self-pay | Admitting: *Deleted

## 2012-08-20 MED ORDER — METOPROLOL SUCCINATE ER 25 MG PO TB24: 25.0000 mg | ORAL_TABLET | Freq: Every day | ORAL | Status: DC

## 2012-11-06 ENCOUNTER — Other Ambulatory Visit: Payer: Self-pay | Admitting: *Deleted

## 2012-11-06 ENCOUNTER — Other Ambulatory Visit: Payer: Self-pay | Admitting: Family Medicine

## 2012-11-06 MED ORDER — LISINOPRIL 10 MG PO TABS: 10.0000 mg | ORAL_TABLET | Freq: Every day | ORAL | Status: DC

## 2012-11-12 ENCOUNTER — Ambulatory Visit (INDEPENDENT_AMBULATORY_CARE_PROVIDER_SITE_OTHER): Payer: BC Managed Care – PPO | Admitting: Family Medicine

## 2012-11-12 ENCOUNTER — Encounter: Payer: Self-pay | Admitting: Family Medicine

## 2012-11-12 VITALS — BP 107/62 | HR 60 | Ht 70.6 in | Wt 186.0 lb

## 2012-11-12 DIAGNOSIS — I10 Essential (primary) hypertension: Secondary | ICD-10-CM

## 2012-11-12 DIAGNOSIS — G4700 Insomnia, unspecified: Secondary | ICD-10-CM

## 2012-11-12 DIAGNOSIS — E785 Hyperlipidemia, unspecified: Secondary | ICD-10-CM

## 2012-11-12 DIAGNOSIS — I2510 Atherosclerotic heart disease of native coronary artery without angina pectoris: Secondary | ICD-10-CM

## 2012-11-12 MED ORDER — ZOLPIDEM TARTRATE 5 MG PO TABS: 5.0000 mg | ORAL_TABLET | Freq: Every evening | ORAL | Status: DC | PRN

## 2012-11-12 NOTE — Progress Notes (Signed)
  Subjective:    Patient ID: Darrell Bridges, male    DOB: Oct 10, 1955, 57 y.o.   MRN: [id number]  HPI HTN -  Pt denies chest pain, SOB, dizziness, or heart palpitations.  Taking meds as directed w/o problems.  Denies medication side effects.    Hyperlipidemia-Taking medication.l No SE or myalgias  Not sleeping well. Only sleep about 3.5-5 hours a day.  Says hard time staying asleep.  Had tried several OTC medication. Has been gongon for years. Says has tried melatonin but says makes him feel cranky when wakes up. He says he doesn't sleep well for days in a row he says it almost hurts to affect his mood he starts feeling down. But he denies feeling truly depressed or having any homicidal or suicidal thoughts.    Review of Systems     Objective:   Physical Exam  Constitutional: He is oriented to person, place, and time. He appears well-developed and well-nourished.  HENT:  Head: Normocephalic and atraumatic.  Cardiovascular: Normal rate, regular rhythm and normal heart sounds.   Pulmonary/Chest: Effort normal and breath sounds normal.  Neurological: He is alert and oriented to person, place, and time.  Skin: Skin is warm and dry.  Psychiatric: He has a normal mood and affect. His behavior is normal.          Assessment & Plan:  Hypertension-Well controlled.  Continue current regimen. His blood pressures a little bit low today. He says overall that he thinks it's well-controlled. Home blood pressures are typically running around the 120s. He says it doesn't run a little bit lower on days that he exercises. Continue with exercise and healthy diet.  Hyperlipidemia-taking medication regulate. Due to recheck lipids. Lab slip given.  Coronary artery disease-On ASA. Statin and betablocker.    Insomnia - Discussed sleep hygiene and discussed sleep aids.  Handout given on sleep hygiene. 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 We did discuss potential use  of sleep aids that he has to make sure he has at least 8 hours before taking such medication so does not impair his driving ability. He understands that. We'll start with Ambien 5 mg to use when necessary. He says he will not use it daily and understands the addictive potential of the medication. Followup in 6 weeks to make sure that it's helping.

## 2012-11-12 NOTE — Patient Instructions (Signed)

## 2012-11-20 LAB — COMPLETE METABOLIC PANEL WITH GFR
ALT: 48 U/L (ref 0–53)
CO2: 28 mEq/L (ref 19–32)
Chloride: 103 mEq/L (ref 96–112)
GFR, Est African American: 88 mL/min
Potassium: 4.3 mEq/L (ref 3.5–5.3)
Sodium: 139 mEq/L (ref 135–145)
Total Bilirubin: 0.7 mg/dL (ref 0.3–1.2)
Total Protein: 6.8 g/dL (ref 6.0–8.3)

## 2012-11-20 LAB — LIPID PANEL
HDL: 44 mg/dL (ref >39)
LDL Cholesterol: 87 mg/dL (ref 0–99)

## 2012-11-20 NOTE — Progress Notes (Signed)
Quick Note:  All labs are normal. ______ 

## 2012-11-21 NOTE — Progress Notes (Signed)
Quick Note:  All labs are normal. ______ 

## 2012-12-24 ENCOUNTER — Ambulatory Visit (INDEPENDENT_AMBULATORY_CARE_PROVIDER_SITE_OTHER): Payer: BC Managed Care – PPO | Admitting: Family Medicine

## 2012-12-24 ENCOUNTER — Encounter: Payer: Self-pay | Admitting: Family Medicine

## 2012-12-24 VITALS — BP 114/66 | HR 57 | Ht 70.0 in | Wt 184.0 lb

## 2012-12-24 DIAGNOSIS — G4700 Insomnia, unspecified: Secondary | ICD-10-CM

## 2012-12-24 MED ORDER — ZOLPIDEM TARTRATE 10 MG PO TABS: 10.0000 mg | ORAL_TABLET | Freq: Every evening | ORAL | Status: DC | PRN

## 2012-12-24 NOTE — Progress Notes (Signed)
  Subjective:    Patient ID: Darrell Bridges, male    DOB: 04/27/1956, 57 y.o.   MRN: [id number]  HPI Insomnia- He is tolerating them well. No SE. Or grogginess w/ the medication.  Did better when took 2 tabs (total of 10mg ).  Has been using it sparingly. Has 15 tabs left.      Review of Systems     Objective:   Physical Exam  Constitutional: He is oriented to person, place, and time. He appears well-developed and well-nourished.  HENT:  Head: Normocephalic and atraumatic.  Neurological: He is alert and oriented to person, place, and time.  Skin: Skin is dry.  Psychiatric: He has a normal mood and affect. His behavior is normal.          Assessment & Plan:  Insomnia - doing well on the medication. Warned about potential for dependency. He says he does understand and will continue to use as needed. We'll go ahead and increase his dose from 5 mg to 10 mg. Refills presented today. Followup as needed. Otherwise I will see him back in 6 months for his routine care for blood pressure and cholesterol.

## 2013-02-04 ENCOUNTER — Other Ambulatory Visit: Payer: Self-pay | Admitting: Family Medicine

## 2013-05-08 ENCOUNTER — Encounter: Payer: Self-pay | Admitting: Family Medicine

## 2013-05-08 ENCOUNTER — Other Ambulatory Visit: Payer: Self-pay | Admitting: Family Medicine

## 2013-05-08 MED ORDER — PRAVASTATIN SODIUM 80 MG PO TABS: 80.0000 mg | ORAL_TABLET | Freq: Every day | ORAL | Status: DC

## 2013-05-31 ENCOUNTER — Other Ambulatory Visit: Payer: Self-pay | Admitting: Family Medicine

## 2013-06-30 ENCOUNTER — Encounter: Payer: Self-pay | Admitting: Family Medicine

## 2013-06-30 ENCOUNTER — Ambulatory Visit (INDEPENDENT_AMBULATORY_CARE_PROVIDER_SITE_OTHER): Payer: BC Managed Care – PPO | Admitting: Family Medicine

## 2013-06-30 VITALS — BP 135/82 | HR 56 | Wt 189.0 lb

## 2013-06-30 DIAGNOSIS — R51 Headache: Secondary | ICD-10-CM

## 2013-06-30 DIAGNOSIS — E785 Hyperlipidemia, unspecified: Secondary | ICD-10-CM

## 2013-06-30 DIAGNOSIS — I10 Essential (primary) hypertension: Secondary | ICD-10-CM

## 2013-06-30 DIAGNOSIS — I2510 Atherosclerotic heart disease of native coronary artery without angina pectoris: Secondary | ICD-10-CM

## 2013-06-30 DIAGNOSIS — G4700 Insomnia, unspecified: Secondary | ICD-10-CM

## 2013-06-30 MED ORDER — LISINOPRIL 10 MG PO TABS: ORAL_TABLET | ORAL | Status: DC

## 2013-06-30 NOTE — Progress Notes (Signed)
Subjective:    Patient ID: Darrell Bridges, male    DOB: 07/12/1956, 57 y.o.   MRN: [id number]  HPI HTN-  Pt denies chest pain, SOB, dizziness, or heart palpitations.  Taking meds as directed w/o problems.  Denies medication side effects.  Hyperlpidemia - Taking pravastatin 80mg  since August.  No side effects or myalgias.  Headache - Woke up with HA at 4AM. Took tylenol and no relief.  Feels like a caffeine withdrawal HA.  Did have a glass of wine last night. Doesn't usually do that. Has has some neck pain too. No snoring.   CAD- may increase his pravastatin 80 mg in August to get his LDL under better control. He's been tolerating it well without any myalgias or side effects. He's not had any chest pain or shortness of breath.  Insomonia -he still has insomnia. He said after he went up on his dose of the Ambien to 10 mg he had a nightmare and systems medication to refill. He still struggling somewhat with sleep. He has never tried trazodone. Right now he would like to hold off on any current medication. Review of Systems  BP 135/82  Pulse 56  Wt 189 lb (85.73 kg)  BMI 27.12 kg/m2    No Known Allergies  Past Medical History  Diagnosis Date  . Hypertension   . Hyperlipidemia   . Nephrolithiasis   . CAD (coronary artery disease)     No past surgical history on file.  History   Social History  . Marital Status: Single    Spouse Name: N/A    Number of Children: 2  . Years of Education: N/A   Occupational History  .  [employer]   Social History Main Topics  . 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 Smokeless tobacco: Not on file  . Alcohol Use: Yes     Comment: Rarely  . Drug Use: No  . Sexual Activity: Yes   Other Topics Concern  . Not on file   Social History Narrative  . No narrative on file    Family History  Problem Relation Age of Onset  . Heart attack Father     Died at 62 of MI  . Diabetes Mother   . Hyperlipidemia    . Hypertension Mother   . Stroke       Outpatient Encounter Prescriptions as of 06/30/2013  Medication Sig Dispense Refill  . aspirin 325 MG EC tablet Take 325 mg by mouth daily.        . fish oil-omega-3 fatty acids 1000 MG capsule Take 2 g by mouth daily.        Marland Kitchen lisinopril (PRINIVIL,ZESTRIL) 10 MG tablet TAKE 1 TABLET EVERY DAY  90 tablet  1  . metoprolol succinate (TOPROL-XL) 25 MG 24 hr tablet TAKE 1 TAB DAILY  90 tablet  2  . nitroGLYCERIN (NITROSTAT) 0.4 MG SL tablet Place 0.4 mg under the tongue every 5 (five) minutes as needed.        . pravastatin (PRAVACHOL) 80 MG tablet Take 1 tablet (80 mg total) by mouth daily.  1 tablet  0  . [DISCONTINUED] lisinopril (PRINIVIL,ZESTRIL) 10 MG tablet TAKE 1 TABLET EVERY DAY  90 tablet  0  . [DISCONTINUED] zolpidem (AMBIEN) 10 MG tablet Take 1 tablet (10 mg total) by mouth at bedtime as needed for sleep.  30 tablet  1   No facility-administered encounter medications on file as of 06/30/2013.  Objective:   Physical Exam  Constitutional: He is oriented to person, place, and time. He appears well-developed and well-nourished.  HENT:  Head: Normocephalic and atraumatic.  Right Ear: External ear normal.  Left Ear: External ear normal.  Nose: Nose normal.  Mouth/Throat: Oropharynx is clear and moist.  TMs and canals are clear.   Eyes: Conjunctivae and EOM are normal. Pupils are equal, round, and reactive to light.  Neck: Neck supple. No thyromegaly present.  Cardiovascular: Normal rate and normal heart sounds.   Pulmonary/Chest: Effort normal and breath sounds normal.  Musculoskeletal:  Neck with normal flexion, extension, rotation right and left and side bending. He does have some tenderness with palpation over the right trapezius muscle along the cervical spine and over the upper back.  Lymphadenopathy:    He has no cervical adenopathy.  Neurological: He is alert and oriented to person, place, and time.  Skin: Skin is warm and dry.  Psychiatric: He has a normal  mood and affect.          Assessment & Plan:  HTN - Well controlled.  Continue current regimen. Followup in 6 months.  Headache - may be secondary to his neck strain as well as taking a glass of wine last night which sometimes gives him a headache. He did sleep while there. The Tylenol dose seem to be providing a small amount of relief. If it continues or the next couple days then please let me know. He does not snore and is not at acute risk for sleep apnea. His blood pressure is well-controlled as well.  Hyperlipdemia - doing well on hoigher dose. Will recheck liver enzynes and repeat lipids. We'll call with results once available.  Cervical strain-work on heat and gentle range of motion. Also recommend using a tennis ball to press on the tightened muscles. If she's not improving over the next week or 2 then please let me know. 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  Insomonia -he still has insomnia. He said after he went up on his dose of the Ambien to 10 mg he had a nightmare and systems medication to refill. He still struggling somewhat with sleep. He has never tried trazodone. Right now he would like to hold off on any current medication. If he does change his mind we could consider trazodone.

## 2013-07-15 LAB — COMPLETE METABOLIC PANEL WITH GFR
Albumin: 4.6 g/dL (ref 3.5–5.2)
Alkaline Phosphatase: 44 U/L (ref 39–117)
BUN: 18 mg/dL (ref 6–23)
GFR, Est Non African American: 69 mL/min
Glucose, Bld: 90 mg/dL (ref 70–99)
Potassium: 4.5 mEq/L (ref 3.5–5.3)
Total Bilirubin: 0.5 mg/dL (ref 0.3–1.2)

## 2013-07-15 LAB — LIPID PANEL
Cholesterol: 145 mg/dL (ref 0–200)
Total CHOL/HDL Ratio: 2.9 Ratio

## 2013-07-16 NOTE — Progress Notes (Signed)
Quick Note:  All labs are normal. ______ 

## 2013-12-30 ENCOUNTER — Ambulatory Visit: Payer: BC Managed Care – PPO | Admitting: Family Medicine

## 2014-01-06 ENCOUNTER — Encounter: Payer: Self-pay | Admitting: Family Medicine

## 2014-01-06 ENCOUNTER — Ambulatory Visit (INDEPENDENT_AMBULATORY_CARE_PROVIDER_SITE_OTHER): Payer: BC Managed Care – PPO | Admitting: Family Medicine

## 2014-01-06 VITALS — BP 122/79 | HR 57 | Wt 185.0 lb

## 2014-01-06 DIAGNOSIS — E785 Hyperlipidemia, unspecified: Secondary | ICD-10-CM

## 2014-01-06 DIAGNOSIS — I2510 Atherosclerotic heart disease of native coronary artery without angina pectoris: Secondary | ICD-10-CM

## 2014-01-06 DIAGNOSIS — I10 Essential (primary) hypertension: Secondary | ICD-10-CM

## 2014-01-06 LAB — BASIC METABOLIC PANEL WITH GFR
BUN: 13 mg/dL (ref 6–23)
CALCIUM: 9.2 mg/dL (ref 8.4–10.5)
CHLORIDE: 105 meq/L (ref 96–112)
CO2: 28 meq/L (ref 19–32)
CREATININE: 1.15 mg/dL (ref 0.50–1.35)
GFR, EST NON AFRICAN AMERICAN: 71 mL/min
GFR, Est African American: 82 mL/min
Glucose, Bld: 88 mg/dL (ref 70–99)
Potassium: 4.4 mEq/L (ref 3.5–5.3)
SODIUM: 141 meq/L (ref 135–145)

## 2014-01-06 MED ORDER — LISINOPRIL 10 MG PO TABS: ORAL_TABLET | ORAL | Status: DC

## 2014-01-06 NOTE — Progress Notes (Signed)
   Subjective:    Patient ID: Merryl HackerDaniel Moss, male    DOB: 06/24/1956, 58 y.o.   MRN: [id number]  HPI Hypertension- Pt denies chest pain, SOB, dizziness, or heart palpitations.  Taking meds as directed w/o problems.  Denies medication side effects.    Hyperlipidemia-tolerating statin well without any side effects or problems.  Coronary artery disease-no chest pain. Last stress test  Was last fall and was normal.     Review of Systems     Objective:   Physical Exam  Constitutional: He is oriented to person, place, and time. He appears well-developed and well-nourished.  HENT:  Head: Normocephalic and atraumatic.  Cardiovascular: Normal rate, regular rhythm and normal heart sounds.   Pulmonary/Chest: Effort normal and breath sounds normal.  Neurological: He is alert and oriented to person, place, and time.  Skin: Skin is warm and dry.  Psychiatric: He has a normal mood and affect. His behavior is normal.          Assessment & Plan:  Hypertension-well-controlled on current regimen. Followup in 6 months. DUe for CMP.    Hyperlipidemia- well controlled. Continue current regimen.   Coronary artery disease-he is on a beta blocker, ACE inhibitor aspirin and statin.

## 2014-01-07 NOTE — Progress Notes (Signed)
Quick Note:  All labs are normal. ______ 

## 2014-02-06 ENCOUNTER — Encounter: Payer: Self-pay | Admitting: Family Medicine

## 2014-02-06 ENCOUNTER — Ambulatory Visit (INDEPENDENT_AMBULATORY_CARE_PROVIDER_SITE_OTHER): Payer: BC Managed Care – PPO | Admitting: Family Medicine

## 2014-02-06 VITALS — BP 105/62 | HR 58 | Wt 188.0 lb

## 2014-02-06 DIAGNOSIS — T464X5A Adverse effect of angiotensin-converting-enzyme inhibitors, initial encounter: Secondary | ICD-10-CM

## 2014-02-06 DIAGNOSIS — R05 Cough: Secondary | ICD-10-CM

## 2014-02-06 DIAGNOSIS — T465X5A Adverse effect of other antihypertensive drugs, initial encounter: Secondary | ICD-10-CM

## 2014-02-06 DIAGNOSIS — R058 Other specified cough: Secondary | ICD-10-CM

## 2014-02-06 DIAGNOSIS — I10 Essential (primary) hypertension: Secondary | ICD-10-CM

## 2014-02-06 DIAGNOSIS — R059 Cough, unspecified: Secondary | ICD-10-CM

## 2014-02-06 MED ORDER — LOSARTAN POTASSIUM 25 MG PO TABS: 25.0000 mg | ORAL_TABLET | Freq: Every day | ORAL | Status: DC

## 2014-02-06 NOTE — Patient Instructions (Signed)
Call if cough is not better in 2-3 weeks.

## 2014-02-06 NOTE — Progress Notes (Signed)
   Subjective:    Patient ID: Darrell Bridges, male    DOB: 11/24/1955, 58 y.o.   MRN: [id number]  HPI Darrell FlesherWent ot Ed yesterday for a persistant cough. Now his chest and lungs were hurting. Had a normal CT angio Chest and CXR, CBC and BMP.  Dry cough.  Feels like a tickle in the throat.  Change of temp in room will trigger it. Laughing triggers it. No GERD.  Cough drops helps.     Review of Systems     Objective:   Physical Exam  Constitutional: He is oriented to person, place, and time. He appears well-developed and well-nourished.  HENT:  Head: Normocephalic and atraumatic.  Right Ear: External ear normal.  Left Ear: External ear normal.  Nose: Nose normal.  Mouth/Throat: Oropharynx is clear and moist.  TMs and canals are clear.   Eyes: Conjunctivae and EOM are normal. Pupils are equal, round, and reactive to light.  Neck: Neck supple. No thyromegaly present.  Cardiovascular: Normal rate and normal heart sounds.   Pulmonary/Chest: Breath sounds normal. No respiratory distress. He has no wheezes. He has no rales. He exhibits tenderness.  Chest is tender over the third and fourth right anterior rib  Lymphadenopathy:    He has no cervical adenopathy.  Neurological: He is alert and oriented to person, place, and time.  Skin: Skin is warm and dry.  Psychiatric: He has a normal mood and affect.          Assessment & Plan:  HTN- Well controlled.  Will switch to an ARB because of possible ACE inhibitor induced cough. He'll monitor his blood pressure home and let me know if it comes elevated. His blood pressures actually little bit low today.  Cough, choronic. Possible from ACEi. Will switch to ARB, if cough is not significantly better in 2-3 weeks then please give me a call. Consider possible treatment for reflux or postnasal drip. He is Re: had a chest CT which was negative for pulmonary embolism and a chest x-ray which rules out pneumonia and bronchitis. He is a former smoker so also  consider spirometry in the future if cough is not resolving.

## 2014-02-27 ENCOUNTER — Other Ambulatory Visit: Payer: Self-pay | Admitting: Family Medicine

## 2014-03-25 ENCOUNTER — Other Ambulatory Visit: Payer: Self-pay | Admitting: *Deleted

## 2014-03-25 MED ORDER — LOSARTAN POTASSIUM 25 MG PO TABS: 25.0000 mg | ORAL_TABLET | Freq: Every day | ORAL | Status: DC

## 2014-03-25 NOTE — Telephone Encounter (Signed)
90 day supply sent to [facility]. Kashus Karlen, CMA 

## 2014-04-03 ENCOUNTER — Other Ambulatory Visit: Payer: Self-pay | Admitting: Family Medicine

## 2014-07-06 ENCOUNTER — Other Ambulatory Visit: Payer: Self-pay | Admitting: Family Medicine

## 2014-07-14 ENCOUNTER — Ambulatory Visit (INDEPENDENT_AMBULATORY_CARE_PROVIDER_SITE_OTHER): Payer: BC Managed Care – PPO | Admitting: Family Medicine

## 2014-07-14 ENCOUNTER — Encounter: Payer: Self-pay | Admitting: Family Medicine

## 2014-07-14 VITALS — BP 136/90 | HR 55 | Wt 189.0 lb

## 2014-07-14 DIAGNOSIS — E785 Hyperlipidemia, unspecified: Secondary | ICD-10-CM

## 2014-07-14 DIAGNOSIS — I10 Essential (primary) hypertension: Secondary | ICD-10-CM | POA: Diagnosis not present

## 2014-07-14 DIAGNOSIS — I2510 Atherosclerotic heart disease of native coronary artery without angina pectoris: Secondary | ICD-10-CM | POA: Diagnosis not present

## 2014-07-14 DIAGNOSIS — R0789 Other chest pain: Secondary | ICD-10-CM | POA: Diagnosis not present
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
## 2014-07-15 LAB — COMPLETE METABOLIC PANEL WITH GFR
ALT: 23 U/L (ref 0–53)
AST: 23 U/L (ref 0–37)
Albumin: 4.7 g/dL (ref 3.5–5.2)
Alkaline Phosphatase: 46 U/L (ref 39–117)
BILIRUBIN TOTAL: 0.7 mg/dL (ref 0.2–1.2)
BUN: 14 mg/dL (ref 6–23)
CO2: 29 mEq/L (ref 19–32)
CREATININE: 1.02 mg/dL (ref 0.50–1.35)
Calcium: 9.8 mg/dL (ref 8.4–10.5)
Chloride: 104 mEq/L (ref 96–112)
GFR, Est African American: >89 mL/min
GFR, Est Non African American: 82 mL/min
Glucose, Bld: 90 mg/dL (ref 70–99)
Potassium: 4.9 mEq/L (ref 3.5–5.3)
Sodium: 141 mEq/L (ref 135–145)
Total Protein: 7 g/dL (ref 6.0–8.3)

## 2014-07-15 LAB — LIPID PANEL
CHOLESTEROL: 144 mg/dL (ref 0–200)
HDL: 54 mg/dL (ref >39)
LDL CALC: 76 mg/dL (ref 0–99)
TRIGLYCERIDES: 70 mg/dL (ref <150)
Total CHOL/HDL Ratio: 2.7 Ratio
VLDL: 14 mg/dL (ref 0–40)

## 2014-07-16 ENCOUNTER — Encounter: Payer: Self-pay | Admitting: Family Medicine

## 2014-07-21 ENCOUNTER — Encounter: Payer: Self-pay | Admitting: Family Medicine

## 2014-12-01 ENCOUNTER — Other Ambulatory Visit: Payer: Self-pay | Admitting: Family Medicine

## 2014-12-20 ENCOUNTER — Other Ambulatory Visit: Payer: Self-pay | Admitting: Family Medicine

## 2015-01-12 ENCOUNTER — Ambulatory Visit (INDEPENDENT_AMBULATORY_CARE_PROVIDER_SITE_OTHER): Payer: BLUE CROSS/BLUE SHIELD | Admitting: Family Medicine

## 2015-01-12 ENCOUNTER — Encounter: Payer: Self-pay | Admitting: Family Medicine

## 2015-01-12 VITALS — BP 123/73 | HR 73 | Ht 70.0 in | Wt 193.0 lb

## 2015-01-12 DIAGNOSIS — I10 Essential (primary) hypertension: Secondary | ICD-10-CM

## 2015-01-12 LAB — BASIC METABOLIC PANEL WITH GFR
BUN: 13 mg/dL (ref 6–23)
CO2: 24 mEq/L (ref 19–32)
Calcium: 9.2 mg/dL (ref 8.4–10.5)
Chloride: 101 mEq/L (ref 96–112)
Creat: 1.12 mg/dL (ref 0.50–1.35)
GFR, EST AFRICAN AMERICAN: 84 mL/min
GFR, Est Non African American: 73 mL/min
GLUCOSE: 100 mg/dL — AB (ref 70–99)
POTASSIUM: 4.6 meq/L (ref 3.5–5.3)
SODIUM: 138 meq/L (ref 135–145)

## 2015-01-12 NOTE — Progress Notes (Signed)
   Subjective:    Patient ID: Darrell Bridges, male    DOB: 02/22/1956, 59 y.o.   MRN: [id number]  HPI  Hypertension- Pt denies chest pain, SOB, dizziness, or heart palpitations.  Taking meds as directed w/o problems.  Denies medication side effects.  Has been seeing some Bps in the 130-0   Review of Systems     Objective:   Physical Exam  Constitutional: He is oriented to person, place, and time. He appears well-developed and well-nourished.  HENT:  Head: Normocephalic and atraumatic.  Cardiovascular: Normal rate, regular rhythm and normal heart sounds.   Pulmonary/Chest: Effort normal and breath sounds normal.  Neurological: He is alert and oriented to person, place, and time.  Skin: Skin is warm and dry.  Psychiatric: He has a normal mood and affect. His behavior is normal.          Assessment & Plan:  HTN - Well controlled on current regimen. F/U in 6 months. Check BMP.

## 2015-01-13 NOTE — Progress Notes (Signed)
Quick Note:  All labs are normal. ______ 

## 2015-05-31 ENCOUNTER — Other Ambulatory Visit: Payer: Self-pay | Admitting: Family Medicine

## 2015-06-15 ENCOUNTER — Other Ambulatory Visit: Payer: Self-pay | Admitting: Family Medicine

## 2015-07-13 ENCOUNTER — Ambulatory Visit (INDEPENDENT_AMBULATORY_CARE_PROVIDER_SITE_OTHER): Payer: BLUE CROSS/BLUE SHIELD | Admitting: Family Medicine

## 2015-07-13 ENCOUNTER — Encounter: Payer: Self-pay | Admitting: Family Medicine

## 2015-07-13 VITALS — BP 137/78 | HR 76 | Wt 191.0 lb

## 2015-07-13 DIAGNOSIS — E7800 Pure hypercholesterolemia, unspecified: Secondary | ICD-10-CM | POA: Insufficient documentation

## 2015-07-13 DIAGNOSIS — I10 Essential (primary) hypertension: Secondary | ICD-10-CM | POA: Diagnosis not present

## 2015-07-13 DIAGNOSIS — Z1159 Encounter for screening for other viral diseases: Secondary | ICD-10-CM

## 2015-07-13 DIAGNOSIS — E785 Hyperlipidemia, unspecified: Secondary | ICD-10-CM | POA: Diagnosis not present

## 2015-07-13 DIAGNOSIS — Z114 Encounter for screening for human immunodeficiency virus [HIV]: Secondary | ICD-10-CM | POA: Diagnosis not present

## 2015-07-13 DIAGNOSIS — I2510 Atherosclerotic heart disease of native coronary artery without angina pectoris: Secondary | ICD-10-CM

## 2015-07-13 LAB — HEPATIC FUNCTION PANEL
ALT: 18 U/L (ref 10–40)
AST: 21 U/L (ref 14–40)
Bilirubin, Total: 0.7 mg/dL

## 2015-07-13 LAB — BASIC METABOLIC PANEL
BUN: 9 mg/dL (ref 4–21)
Creatinine: 1 mg/dL (ref <=1.3)
GLUCOSE: 96 mg/dL
Potassium: 4.1 mmol/L (ref 3.4–5.3)
Sodium: 139 mmol/L (ref 137–147)

## 2015-07-13 LAB — LIPID PANEL
Cholesterol: 137 mg/dL (ref 0–200)
HDL: 51 mg/dL (ref 35–70)
LDL Cholesterol: 72 mg/dL
Triglycerides: 69 mg/dL (ref 40–160)

## 2015-07-13 NOTE — Progress Notes (Signed)
   Subjective:    Patient ID: Darrell Bridges, male    DOB: 01/05/1956, 59 y.o.   MRN: [id number]  HPI Hypertension- Pt denies chest pain, SOB, dizziness, or heart palpitations.  Taking meds as directed w/o problems.  Denies medication side effects.  Not exercising regularly. Had some caffeine today. Took his NTG twice in the last 6 months.  Says to worked well.  He has his f/u with cardiologist later today.    coronary artery disease-asymptomatic. Has follow-up with cardiology later today. He has had to take his nitroglycerin a couple of times but he gets recurrent chest pain and has had it for years. The pain did not persist.   hyperlipidemia-tolerating statin well with no SE.    Review of Systems     Objective:   Physical Exam  Constitutional: He is oriented to person, place, and time. He appears well-developed and well-nourished.  HENT:  Head: Normocephalic and atraumatic.  Cardiovascular: Normal rate, regular rhythm and normal heart sounds.   Pulmonary/Chest: Effort normal and breath sounds normal.  Neurological: He is alert and oriented to person, place, and time.  Skin: Skin is warm and dry.  Psychiatric: He has a normal mood and affect. His behavior is normal.          Assessment & Plan:  HTN -   Well controlled.  Says he is doing well.  No changes to regimen.  Has f/u with cards later today. F/U in 6 months. Due for CMP and lipids.     hyperlipidemia-continue statin.  Coronary artery disease-stable. Has follow-up with cardiology today. Continue statin, aspirin , beta blocker , and ARB.

## 2015-07-13 NOTE — Patient Instructions (Signed)
You NEED to start a regular exercise program!  I encourage you to shoot for 5 days per week.

## 2015-08-18 ENCOUNTER — Other Ambulatory Visit: Payer: Self-pay | Admitting: Family Medicine

## 2015-08-31 ENCOUNTER — Ambulatory Visit (INDEPENDENT_AMBULATORY_CARE_PROVIDER_SITE_OTHER): Payer: BLUE CROSS/BLUE SHIELD | Admitting: Family Medicine

## 2015-08-31 ENCOUNTER — Encounter: Payer: Self-pay | Admitting: Family Medicine

## 2015-08-31 VITALS — BP 127/76 | HR 64 | Ht 70.0 in | Wt 190.6 lb

## 2015-08-31 DIAGNOSIS — Z833 Family history of diabetes mellitus: Secondary | ICD-10-CM | POA: Diagnosis not present

## 2015-08-31 DIAGNOSIS — R8290 Unspecified abnormal findings in urine: Secondary | ICD-10-CM | POA: Diagnosis not present

## 2015-08-31 LAB — POCT URINALYSIS DIPSTICK
Bilirubin, UA: NEGATIVE
Blood, UA: NEGATIVE
Glucose, UA: NEGATIVE
Ketones, UA: NEGATIVE
Leukocytes, UA: NEGATIVE
Nitrite, UA: NEGATIVE
Protein, UA: NEGATIVE
Spec Grav, UA: 1.01
Urobilinogen, UA: 0.2
pH, UA: 6.5

## 2015-08-31 LAB — POCT GLYCOSYLATED HEMOGLOBIN (HGB A1C): HEMOGLOBIN A1C: 5.2

## 2015-08-31 LAB — POCT UA - MICROALBUMIN
Albumin/Creatinine Ratio, Urine, POC: <30
Creatinine, POC: 200 mg/dL
Microalbumin Ur, POC: 10 mg/L

## 2015-08-31 NOTE — Progress Notes (Signed)
   Subjective:    Patient ID: Darrell Bridges, male    DOB: 08/31/1956, 59 y.o.   MRN: [id number]  HPI Says he has noticed a strong odor to urine after exercise x 1 yr.  Says he has a family hx of diabetes. No discomfort or pain. No taking any pre- work out or creatinine.  Passed a kidney stone when he was 40.  Had CMP and lipid done in October with Dr. Donnetta Bridges.  They were normal.  No family history of bladder cancer or renal cancer. He is a former smoker.  Review of Systems     Objective:   Physical Exam  Constitutional: He is oriented to person, place, and time. He appears well-developed and well-nourished.  HENT:  Head: Normocephalic and atraumatic.  Eyes: Conjunctivae and EOM are normal.  Cardiovascular: Normal rate.   Pulmonary/Chest: Effort normal.  Abdominal: Soft. He exhibits no distension and no mass. There is no tenderness. There is no rebound and no guarding.  Neurological: He is alert and oriented to person, place, and time.  Skin: Skin is dry. No pallor.  Psychiatric: He has a normal mood and affect. His behavior is normal.  Vitals reviewed.         Assessment & Plan:  Urinated or-hemoglobin A1c of 5.2 today. No sign of diabetes or impaired fasting glucose. Normal urinalysis. We'll also do a urine microalbumin. Also like him to collect another sample on the day post exercise to see if there's anything different in the urine. Recent creatinine was 0.95 with Dr. Jonette Bridges which looks fantastic.

## 2015-09-01 ENCOUNTER — Encounter: Payer: Self-pay | Admitting: Family Medicine

## 2015-09-02 ENCOUNTER — Telehealth: Payer: Self-pay | Admitting: *Deleted

## 2015-09-02 LAB — URINE CULTURE
COLONY COUNT: NO GROWTH
ORGANISM ID, BACTERIA: NO GROWTH

## 2015-09-02 NOTE — Telephone Encounter (Signed)
[facility] called to let us know that the urine sent for the microscopic was sent in the wrong container, it needs to be in the reg sterile urine cup.  Do we need to recollect a sample from the pt? Please advise.

## 2015-09-03 ENCOUNTER — Telehealth: Payer: Self-pay

## 2015-09-03 NOTE — Telephone Encounter (Signed)
Call pt: Let recollect just for the microscopic review.

## 2015-09-03 NOTE — Telephone Encounter (Signed)
Left message for patient to call back regarding his urine results and referral recommendations.

## 2015-11-25 ENCOUNTER — Other Ambulatory Visit: Payer: Self-pay | Admitting: Family Medicine

## 2015-12-20 ENCOUNTER — Other Ambulatory Visit: Payer: Self-pay | Admitting: Family Medicine

## 2015-12-27 ENCOUNTER — Ambulatory Visit (INDEPENDENT_AMBULATORY_CARE_PROVIDER_SITE_OTHER): Payer: BLUE CROSS/BLUE SHIELD | Admitting: Family Medicine

## 2015-12-27 ENCOUNTER — Encounter: Payer: Self-pay | Admitting: Family Medicine

## 2015-12-27 VITALS — BP 122/73 | HR 54 | Ht 70.0 in | Wt 189.0 lb

## 2015-12-27 DIAGNOSIS — I10 Essential (primary) hypertension: Secondary | ICD-10-CM | POA: Diagnosis not present

## 2015-12-27 DIAGNOSIS — Z8349 Family history of other endocrine, nutritional and metabolic diseases: Secondary | ICD-10-CM | POA: Diagnosis not present

## 2015-12-27 DIAGNOSIS — Z125 Encounter for screening for malignant neoplasm of prostate: Secondary | ICD-10-CM

## 2015-12-27 DIAGNOSIS — I2510 Atherosclerotic heart disease of native coronary artery without angina pectoris: Secondary | ICD-10-CM

## 2015-12-27 NOTE — Addendum Note (Signed)
Addended by: Deno EtienneBARKLEY, Rayneisha Bouza L on: 12/27/2015 09:14 AM   Modules accepted: Orders

## 2015-12-27 NOTE — Progress Notes (Signed)
Subjective:    Patient ID: Darrell Bridges, male    DOB: May 25, 1956, 60 y.o.   MRN: [id number]  HPI Hypertension- Pt denies chest pain, SOB, dizziness, or heart palpitations.  Taking meds as directed w/o problems.  Denies medication side effects.     CAD - No recent chest pain or shortness of breath. He has not been exercising regularly. Next  His brother was diagnosed with hypothyroidism about a year or 2 ago. He would like to have his checked as well.  Review of Systems   BP 122/73 mmHg  Pulse 54  Ht  (1.778 m)  Wt 189 lb (85.73 kg)  BMI 27.12 kg/m2  SpO2 99%    Allergies  Allergen Reactions  . Lisinopril     Other reaction(s): Cough    Past Medical History  Diagnosis Date  . Hypertension   . Hyperlipidemia   . Nephrolithiasis   . CAD (coronary artery disease)     History reviewed. No pertinent past surgical history.  Social History   Social History  . Marital Status: Single    Spouse Name: N/A  . Number of Children: 2  . Years of Education: N/A   Occupational History  .  [employer]   Social History Main Topics  . 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 Smokeless tobacco: Not on file  . Alcohol Use: Yes     Comment: Rarely  . Drug Use: No  . Sexual Activity: Yes   Other Topics Concern  . Not on file   Social History Narrative    Family History  Problem Relation Age of Onset  . Heart attack Father     Died at 80 of MI  . Diabetes Mother   . Hyperlipidemia    . Hypertension Mother   . Stroke    . Thyroid disease Brother     Outpatient Encounter Prescriptions as of 12/27/2015  Medication Sig  . aspirin 325 MG EC tablet Take 325 mg by mouth daily.    Marland Kitchen KRILL OIL PO Take 350 mg by mouth daily.  Marland Kitchen losartan (COZAAR) 50 MG tablet Take by mouth.  . magnesium gluconate (MAGONATE) 500 MG tablet Take 500 mg by mouth daily.  . metoprolol succinate (TOPROL-XL) 25 MG 24 hr tablet TAKE 1 TABLET BY MOUTH EVERY DAY  . Multiple Vitamin (ONE-A-DAY MENS  PO) Take 1 capsule by mouth daily.  . nitroGLYCERIN (NITROSTAT) 0.4 MG SL tablet Place 0.4 mg under the tongue.  Marland Kitchen atorvastatin (LIPITOR) 20 MG tablet Take 20 mg by mouth daily.  . [DISCONTINUED] losartan (COZAAR) 25 MG tablet TAKE 1 TABLET (25 MG TOTAL) BY MOUTH DAILY.   No facility-administered encounter medications on file as of 12/27/2015.          Objective:   Physical Exam  Constitutional: He is oriented to person, place, and time. He appears well-developed and well-nourished.  HENT:  Head: Normocephalic and atraumatic.  Cardiovascular: Normal rate, regular rhythm and normal heart sounds.   Pulmonary/Chest: Effort normal and breath sounds normal.  Neurological: He is alert and oriented to person, place, and time.  Skin: Skin is warm and dry.  Psychiatric: He has a normal mood and affect. His behavior is normal.          Assessment & Plan:  HTN - Well controlled. Continue current regimen. Follow-up in 6 months. Due for CMP.  CAD- currently asymptomatic. Will follow back up with cardiology in 6 months and will have a repeat stress  test at that time. Next  Family history of thyroid disease-we'll check TSH.

## 2016-01-07 LAB — HEPATIC FUNCTION PANEL
ALT: 18 U/L (ref 10–40)
AST: 23 U/L (ref 14–40)
Alkaline Phosphatase: 58 U/L (ref 25–125)
BILIRUBIN, TOTAL: 0.7 mg/dL

## 2016-01-07 LAB — BASIC METABOLIC PANEL
BUN: 10 mg/dL (ref 4–21)
Creatinine: 1.1 mg/dL (ref 0.6–1.3)
GLUCOSE: 101 mg/dL
POTASSIUM: 4.9 mmol/L (ref 3.4–5.3)
SODIUM: 141 mmol/L (ref 137–147)

## 2016-01-07 LAB — TSH: TSH: 3.14 u[IU]/mL (ref 0.41–5.90)

## 2016-01-10 ENCOUNTER — Telehealth: Payer: Self-pay | Admitting: Family Medicine

## 2016-01-10 NOTE — Telephone Encounter (Signed)
Patient advised of results.

## 2016-01-10 NOTE — Telephone Encounter (Signed)
Call patient: Kidney function is stable with a creatinine of 1.1. Liver enzymes are normal. Thyroid level is normal. Prostate level was normal.

## 2016-01-10 NOTE — Telephone Encounter (Signed)
Left VM for Pt to return clinic call regarding results. Callback information provided.

## 2016-01-21 ENCOUNTER — Encounter: Payer: Self-pay | Admitting: Family Medicine

## 2016-01-21 LAB — PSA: Prostate Specific Ag, Serum: 0.4

## 2016-02-04 ENCOUNTER — Encounter: Payer: Self-pay | Admitting: Family Medicine

## 2016-05-21 ENCOUNTER — Other Ambulatory Visit: Payer: Self-pay | Admitting: Family Medicine

## 2016-11-28 ENCOUNTER — Encounter: Payer: Self-pay | Admitting: Family Medicine

## 2016-11-28 ENCOUNTER — Ambulatory Visit (INDEPENDENT_AMBULATORY_CARE_PROVIDER_SITE_OTHER): Payer: BLUE CROSS/BLUE SHIELD | Admitting: Family Medicine

## 2016-11-28 VITALS — BP 130/82 | HR 51 | Ht 70.0 in | Wt 193.0 lb

## 2016-11-28 DIAGNOSIS — R21 Rash and other nonspecific skin eruption: Secondary | ICD-10-CM | POA: Diagnosis not present

## 2016-11-28 DIAGNOSIS — H8112 Benign paroxysmal vertigo, left ear: Secondary | ICD-10-CM | POA: Diagnosis not present

## 2016-11-28 MED ORDER — METOPROLOL SUCCINATE ER 25 MG PO TB24: 25.0000 mg | ORAL_TABLET | Freq: Every day | ORAL | 1 refills | Status: DC

## 2016-11-28 NOTE — Addendum Note (Signed)
Addended by: Deno EtienneBARKLEY, Kenner Lewan L on: 11/28/2016 04:26 PM   Modules accepted: Orders

## 2016-11-28 NOTE — Progress Notes (Signed)
Subjective:    Patient ID: Darrell Bridges, male    DOB: [birth year], 61 y.o.   MRN: [id number]  HPI Rash - He noticed a rash on his mid abdomen just below the umbilicus basically along with his parent line is. It's a little bit red and a little scaly. No pain discomfort or itching. He started using some over-the-counter hydrocortisone on it and it really hasn't gotten better and hasn't gotten worse. No fevers chills or sweats.He denies any new soaps lotions perfumes etc. He has recently started using dryer sheets again. He says he is only noticed the rash along his belt line.   He has been battling the positional vertigo again. He's had multiple episodes on and off since the summer time. He is getting to the point where it's starting to bother him more. He notices it mostly when he gets ready to lay back in bed. Occasionally get a head rush sensation. He has been trying to do the exercises.   Review of Systems  BP 130/82   Pulse (!) 51   Ht 5\' 10"  (1.778 m)   Wt 193 lb (87.5 kg)   SpO2 100%   BMI 27.69 kg/m     Allergies  Allergen Reactions  . Lisinopril     Other reaction(s): Cough    Past Medical History:  Diagnosis Date  . CAD (coronary artery disease)   . Hyperlipidemia   . Hypertension   . Nephrolithiasis     No past surgical history on file.  Social History   Social History  . Marital status: Single    Spouse name: N/A  . Number of children: 2  . Years of education: N/A   Occupational History  .  [employer]   Social History Main Topics  . 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 Smokeless tobacco: Never Used  . Alcohol use Yes     Comment: Rarely  . Drug use: No  . Sexual activity: Yes   Other Topics Concern  . Not on file   Social History Narrative  . No narrative on file    Family History  Problem Relation Age of Onset  . Heart attack Father     Died at 80 of MI  . Diabetes Mother   . Hyperlipidemia    . Hypertension Mother   . Stroke    .  Thyroid disease Brother     Outpatient Encounter Prescriptions as of 11/28/2016  Medication Sig  . aspirin 325 MG EC tablet Take 325 mg by mouth daily.    Marland Kitchen KRILL OIL PO Take 350 mg by mouth daily.  Marland Kitchen losartan (COZAAR) 50 MG tablet Take by mouth.  . magnesium gluconate (MAGONATE) 500 MG tablet Take 500 mg by mouth daily.  . metoprolol succinate (TOPROL-XL) 25 MG 24 hr tablet Take 1 tablet (25 mg total) by mouth daily.  . Multiple Vitamin (ONE-A-DAY MENS PO) Take 1 capsule by mouth daily.  . nitroGLYCERIN (NITROSTAT) 0.4 MG SL tablet Place 0.4 mg under the tongue.  . [DISCONTINUED] metoprolol succinate (TOPROL-XL) 25 MG 24 hr tablet TAKE 1 TABLET BY MOUTH EVERY DAY  . atorvastatin (LIPITOR) 20 MG tablet Take 20 mg by mouth daily.   No facility-administered encounter medications on file as of 11/28/2016.          Objective:   Physical Exam  Constitutional: He is oriented to person, place, and time. He appears well-developed and well-nourished.  HENT:  Head: Normocephalic and atraumatic.  Right Ear:  External ear normal.  Left Ear: External ear normal.  Nose: Nose normal.  Mouth/Throat: Oropharynx is clear and moist.  TMs and canals are clear.   Eyes: Conjunctivae and EOM are normal. Pupils are equal, round, and reactive to light.  Neck: Neck supple. No thyromegaly present.  Cardiovascular: Normal rate and normal heart sounds.   Pulmonary/Chest: Effort normal and breath sounds normal.  Lymphadenopathy:    He has no cervical adenopathy.  Neurological: He is alert and oriented to person, place, and time.  Skin: Skin is warm and dry.  On abdomen he has a mildly erythematous area with irregular shaped border approximately 4 x 3 cm in size. He has slightly smaller lesions to the right and left of this just lateral to the umbilicus. There is just a fine scale on all 3 lesions. No papules. No pustules. No satellite lesions.  Psychiatric: He has a normal mood and affect.       Assessment  & Plan:  Rash-unclear etiology at this point. Skin Scraping performed. Will call with results once available. Suspicious for 10 any adverse a color. Will call with results once a available. Also discussed could be a contact dermatitis from his belt buckle or the metal on his jeans. Though he says he normally wears a shirt tucked in.  Recurrent positional vertigo-recommend formal vestibular rehabilitation for treatment since he keeps getting episodes recurrently for the last 6-8 months.

## 2016-11-30 LAB — FUNGAL STAIN

## 2016-11-30 MED ORDER — TRIAMCINOLONE ACETONIDE 0.1 % EX CREA: 1.0000 "application " | TOPICAL_CREAM | Freq: Two times a day (BID) | CUTANEOUS | 0 refills | Status: DC

## 2016-11-30 NOTE — Addendum Note (Signed)
Addended by: Nani GasserMETHENEY, Egor Fullilove D on: 11/30/2016 05:09 PM   Modules accepted: Orders

## 2016-12-01 ENCOUNTER — Ambulatory Visit
Payer: BLUE CROSS/BLUE SHIELD | Attending: Family Medicine | Admitting: Rehabilitative and Restorative Service Providers"

## 2016-12-01 DIAGNOSIS — H8111 Benign paroxysmal vertigo, right ear: Secondary | ICD-10-CM | POA: Diagnosis not present

## 2016-12-01 DIAGNOSIS — R42 Dizziness and giddiness: Secondary | ICD-10-CM | POA: Diagnosis present

## 2016-12-01 NOTE — Therapy (Signed)
[facility] [address] [place], [place], [address] Phone: [phone]   Fax:  [phone]  Physical Therapy Evaluation  Patient Details  Name: Darrell Bridges MRN: [id number] Date of Birth: 1955-11-15 Referring Provider: Alden Hipp, MD  Encounter Date: 12/01/2016      PT End of Session - 12/01/16 0918    Visit Number 1   Number of Visits 4   Date for PT Re-Evaluation 01/15/17   Authorization Type private insurance   PT Start Time [phone]   PT Stop Time 0930   PT Time Calculation (min) 42 min   Activity Tolerance Patient tolerated treatment well   Behavior During Therapy [facility] for tasks assessed/performed      Past Medical History:  Diagnosis Date  . CAD (coronary artery disease)   . Hyperlipidemia   . Hypertension   . Nephrolithiasis     No past surgical history on file.  There were no vitals filed for this visit.       Subjective Assessment - 12/01/16 0843    Subjective The patient has h/o episodic vertigo for >10 years that comes and goes.  He notes consistent sensation of positional symptoms worse with lying straight back or rolling to the left.   He functions well day to day and feels he can drive.     Patient Stated Goals Treat the vertigo.    Currently in Pain? No/denies            [facility] PT Assessment - 12/01/16 0852      Assessment   Medical Diagnosis BPPV, left   Referring Provider Alden Hipp, MD   Onset Date/Surgical Date --  6 months ago   Prior Therapy none     Precautions   Precautions None     Restrictions   Weight Bearing Restrictions No     Home Environment   Living Environment Private residence     Prior Function   Level of Independence Independent     Observation/Other Assessments   Focus on Therapeutic Outcomes (FOTO)  56%   Other Surveys  Other Surveys   Dizziness Handicap Inventory [facility])  26%            Vestibular Assessment - 12/01/16 0856       Vestibular Assessment   General Observation Ambulates independently without a device     Symptom Behavior   Type of Dizziness Spinning   Frequency of Dizziness daily   Duration of Dizziness seconds   Aggravating Factors Mornings;Turning head quickly   Relieving Factors Head stationary     Occulomotor Exam   Occulomotor Alignment Normal   Spontaneous Absent   Gaze-induced Absent   Smooth Pursuits Intact   Saccades Intact     Vestibulo-Occular Reflex   VOR 1 Head Only (x 1 viewing) slow gaze x 1 able to maintain fixation     Positional Testing   Dix-Hallpike Dix-Hallpike Right;Dix-Hallpike Left   Sidelying Test Sidelying Right;Sidelying Left   Horizontal Canal Testing Horizontal Canal Right;Horizontal Canal Left     Dix-Hallpike Right   Dix-Hallpike Right Duration Used frenzels b/c no nystagmus viewed in room light   Dix-Hallpike Right Symptoms Downbeat Nystagmus  mild rotation, however hard to differentiate even with lense     Dix-Hallpike Left   Dix-Hallpike Left Duration used frenzels b/c not viewed in room light   Dix-Hallpike Left Symptoms Downbeat Nystagmus  hard to differentiate direction of rotary component     Sidelying Right   Sidelying  Right Duration nomral   Sidelying Right Symptoms No nystagmus     Sidelying Left   Sidelying Left Duration normal   Sidelying Left Symptoms No nystagmus     Horizontal Canal Right   Horizontal Canal Right Duration used frenzels   Horizontal Canal Right Symptoms Normal     Horizontal Canal Left   Horizontal Canal Left Duration used frenzels, lasted >50 seconds   Horizontal Canal Left Symptoms Ageotrophic                Vestibular Treatment/Exercise - 12/01/16 0917      Vestibular Treatment/Exercise   Vestibular Treatment Provided Canalith Repositioning;Habituation   Canalith Repositioning Comment;Canal Roll Right   Habituation Exercises Horizontal Roll     Canal Roll Right   Number of Reps  1   Overall  Response  Improved Symptoms   Response Details  Subjectively improved, however still note a trace of apogeotropic nystagmus with L rolling, and trace of upbeat/rotary L with looking towards ground (? may indicate multi-canal)     OTHER   Comment Performed Canolith repositioning maneuver for right horizontal cupulolithiasis (used KIm, 2011 with vibration at 1st and 4th position x 20 seconds).  Patient tolerates position well.     Horizontal Roll   Number of Reps  1               PT Education - 12/01/16 1646    Education provided Yes   Education Details habituation HEP, nature of BPPV   Person(s) Educated Patient   Methods Explanation;Demonstration;Handout   Comprehension Returned demonstration;Verbalized understanding          PT Short Term Goals - 12/01/16 0919      PT SHORT TERM GOAL #1   Title The patient will be independent with HEP for habituation as indicated   Baseline Target date 12/31/2016   Time 4   Period Weeks     PT SHORT TERM GOAL #2   Title The patient will have negative positional testing indicating resolution of BPPV.   Baseline Target date 12/31/2016   Time 4   Period Weeks           PT Long Term Goals - 12/01/16 0921      PT LONG TERM GOAL #1   Title The patient will improve dizziness handicap index from 26% down to 10% to demo dec'd self perception of dizziness.   Baseline Target date  01/15/2017   Time 6   Period Weeks               Plan - 12/01/16 [id number]    Clinical Impression Statement The patient is a 61 yo male with h/o episodic positional vertigo that seemed to self resolve in the past.  He notes recurrence approximately 6+ months ago, and feels symptoms create a "shifting" sensation at times when lying on his back (perception of self movement), and dizziness with left rolling.  Clinical exam today reveals R horizontal cupulolithiasis per apogeotropic nystagmus with L horizontal rolling.     Rehab Potential Good   PT Frequency 1x /  week   PT Duration 4 weeks   PT Treatment/Interventions ADLs/Self Care Home Management;Canalith Repostioning;Neuromuscular re-education;Vestibular;Patient/family education   PT Next Visit Plan Recheck BPPV *use frenzels to block fixation; review habituation if needed/ treat as indicated.   Consulted and Agree with Plan of Care Patient      Patient will benefit from skilled therapeutic intervention in order to improve the following deficits and impairments:  Dizziness, Decreased balance  Visit Diagnosis: BPPV (benign paroxysmal positional vertigo), right  Dizziness and giddiness     Problem List Patient Active Problem List   Diagnosis Date Noted  . Hyperlipidemia 05/24/2011  . Hypertension 05/24/2011  . Palpitations 05/24/2011  . MYOCARDIAL INFARCTION, ACUTE, SUBENDOCARDIAL 09/08/2010  . Coronary artery disease 07/20/2010    Tylar Merendino, PT 12/01/2016, 4:48 PM  [place] [facility] [address] [place], [place], [address] Phone: [phone]   Fax:  [phone]  Name: Darrell Bridges MRN: [id number] Date of Birth: 1955-11-12

## 2016-12-01 NOTE — Patient Instructions (Signed)
Habituation - Tip Card  1.The goal of habituation training is to assist in decreasing symptoms of vertigo, dizziness, or nausea provoked by specific head and body motions. 2.These exercises may initially increase symptoms; however, be persistent and work through symptoms. With repetition and time, the exercises will assist in reducing or eliminating symptoms. 3.Exercises should be stopped and discussed with the therapist if you experience any of the following: - Sudden change or fluctuation in hearing - New onset of ringing in the ears, or increase in current intensity - Any fluid discharge from the ear - Severe pain in neck or back - Extreme nausea  Copyright  VHI. All rights reserved.   Habituation - Rolling   With pillow under head, start on back. Roll to your right side.  Hold until dizziness stops, plus 20 seconds and then roll to the left side.  Hold until dizziness stops, plus 20 seconds.  Repeat sequence 5 times per session. Do 2 sessions per day.  Copyright  VHI. All rights reserved.        

## 2016-12-15 ENCOUNTER — Ambulatory Visit: Payer: BLUE CROSS/BLUE SHIELD | Admitting: Rehabilitative and Restorative Service Providers"

## 2016-12-15 DIAGNOSIS — R42 Dizziness and giddiness: Secondary | ICD-10-CM

## 2016-12-15 DIAGNOSIS — H8111 Benign paroxysmal vertigo, right ear: Secondary | ICD-10-CM | POA: Diagnosis not present

## 2016-12-15 NOTE — Therapy (Signed)
[facility] [address] [place], [place], [address] Phone: [phone]   Fax:  [phone]  Physical Therapy Treatment and Discharge Summary  Patient Details  Name: Darrell Bridges MRN: [id number] Date of Birth: 05-03-1956 Referring Provider: Minna Merritts, MD  Encounter Date: 12/15/2016      PT End of Session - 12/15/16 1115    Visit Number 2   Number of Visits 4   Date for PT Re-Evaluation 01/15/17   Authorization Type private insurance   PT Start Time 1110   PT Stop Time 1120   PT Time Calculation (min) 10 min   Activity Tolerance Patient tolerated treatment well   Behavior During Therapy [facility] for tasks assessed/performed      Past Medical History:  Diagnosis Date  . CAD (coronary artery disease)   . Hyperlipidemia   . Hypertension   . Nephrolithiasis     No past surgical history on file.  There were no vitals filed for this visit.      Subjective Assessment - 12/15/16 1112    Subjective The patient reports he had one mild episode since last time and no further issues.     Patient Stated Goals Treat the vertigo.    Currently in Pain? No/denies                Vestibular Assessment - 12/15/16 1121      Positional Testing   Horizontal Canal Testing Horizontal Canal Right;Horizontal Canal Left     Horizontal Canal Right   Horizontal Canal Right Duration none   Horizontal Canal Right Symptoms Normal     Horizontal Canal Left   Horizontal Canal Left Duration none   Horizontal Canal Left Symptoms Normal                 [facility] Adult PT Treatment/Exercise - 12/15/16 1123      Self-Care   Self-Care Other Self-Care Comments   Other Self-Care Comments  discussed self mgmt of symptoms moving forward.         Vestibular Treatment/Exercise - 12/15/16 1127      Vestibular Treatment/Exercise   Vestibular Treatment Provided Habituation   Habituation Exercises Horizontal Roll      Horizontal Roll   Symptom Description  Recommended holding current home program as it does not provoke symptoms.                PT Education - 12/15/16 1127    Education provided Yes   Education Details Discussed self mgmt of recurring BPPV   Person(s) Educated Patient   Methods Explanation   Comprehension Verbalized understanding          PT Short Term Goals - 12/15/16 1115      PT SHORT TERM GOAL #1   Title The patient will be independent with HEP for habituation as indicated   Baseline Had no symptoms with habituation exercises and felt he did not need it/ recommended he discharge it.    Time 4   Period Weeks   Status Achieved     PT SHORT TERM GOAL #2   Title The patient will have negative positional testing indicating resolution of BPPV.   Baseline Target date 12/31/2016   Time 4   Period Weeks   Status Achieved           PT Long Term Goals - 12/15/16 1126      PT LONG TERM GOAL #1   Title The patient will improve dizziness handicap  index from 26% down to 10% to demo dec'd self perception of dizziness.   Baseline Met improving to 0% on [facility]   Time 6   Period Weeks   Status Achieved               Plan - 12/15/16 1128    Clinical Impression Statement The patient met STGs/LTGs with resolution of BPPV today.   PT Next Visit Plan Discharge visit.   Consulted and Agree with Plan of Care Patient      Patient will benefit from skilled therapeutic intervention in order to improve the following deficits and impairments:     Visit Diagnosis: BPPV (benign paroxysmal positional vertigo), right  Dizziness and giddiness    PHYSICAL THERAPY DISCHARGE SUMMARY  Visits from Start of Care: 2  Current functional level related to goals / functional outcomes: See above   Remaining deficits: none   Education / Equipment: Self mgmt of recurring BPPV.  Plan: Patient agrees to discharge.  Patient goals were met. Patient is being discharged due to  meeting the stated rehab goals.  ?????        Thank you for the referral of this patient. Rudell Cobb, MPT  [place] 12/15/2016, 11:46 AM  [facility] [address] [place], [place], [address] Phone: [phone]   Fax:  [phone]  Name: Darrell Bridges MRN: [id number] Date of Birth: [birth year]

## 2016-12-19 ENCOUNTER — Ambulatory Visit (INDEPENDENT_AMBULATORY_CARE_PROVIDER_SITE_OTHER): Payer: BLUE CROSS/BLUE SHIELD | Admitting: Family Medicine

## 2016-12-19 ENCOUNTER — Encounter: Payer: Self-pay | Admitting: Family Medicine

## 2016-12-19 VITALS — BP 124/74 | HR 57 | Ht 70.0 in | Wt 193.0 lb

## 2016-12-19 DIAGNOSIS — H8111 Benign paroxysmal vertigo, right ear: Secondary | ICD-10-CM

## 2016-12-19 DIAGNOSIS — I10 Essential (primary) hypertension: Secondary | ICD-10-CM

## 2016-12-19 DIAGNOSIS — R21 Rash and other nonspecific skin eruption: Secondary | ICD-10-CM

## 2016-12-19 DIAGNOSIS — Z125 Encounter for screening for malignant neoplasm of prostate: Secondary | ICD-10-CM

## 2016-12-19 DIAGNOSIS — E785 Hyperlipidemia, unspecified: Secondary | ICD-10-CM | POA: Diagnosis not present

## 2016-12-19 NOTE — Progress Notes (Signed)
   Subjective:    CC: HTN   HPI:  Hypertension- Pt denies chest pain, SOB, dizziness, or heart palpitations.  Taking meds as directed w/o problems.  Denies medication side effects.    RAsh - is much better with the steroid cream.  Still has some skin discoloration but better.    Vertigo is much better.  He had one treatment and his vertigo he was battling for 8 months is resolved.  It was coming from his right ear.   Past medical history, Surgical history, Family history not pertinant except as noted below, Social history, Allergies, and medications have been entered into the medical record, reviewed, and corrections made.   Review of Systems: No fevers, chills, night sweats, weight loss, chest pain, or shortness of breath.   Objective:    General: Well Developed, well nourished, and in no acute distress.  Neuro: Alert and oriented x3, extra-ocular muscles intact, sensation grossly intact.  HEENT: Normocephalic, atraumatic  Skin: Warm and dry, no rashes. Cardiac: Regular rate and rhythm, no murmurs rubs or gallops, no lower extremity edema.  Respiratory: Clear to auscultation bilaterally. Not using accessory muscles, speaking in full sentences.   Impression and Recommendations:    b    Objective:    General: Well Developed, well nourished, and in no acute distress.  Neuro: Alert and oriented x3, extra-ocular muscles intact, sensation grossly intact.  HEENT: Normocephalic, atraumatic  Skin: Warm and dry, no rashes. Cardiac: Regular rate and rhythm, no murmurs rubs or gallops, no lower extremity edema.  Respiratory: Clear to auscultation bilaterally. Not using accessory muscles, speaking in full sentences.   Impression and Recommendations:    HTN  - Well controlled. Continue current regimen. Follow up in  6 months.   Vertigo - resolved.   Rash - resolved.

## 2016-12-25 ENCOUNTER — Ambulatory Visit: Payer: BLUE CROSS/BLUE SHIELD | Admitting: Family Medicine

## 2016-12-29 LAB — HEPATIC FUNCTION PANEL
ALK PHOS: 55 U/L (ref 25–125)
ALT: 21 U/L (ref 10–40)
AST: 27 U/L (ref 14–40)
BILIRUBIN, TOTAL: 0.5 mg/dL

## 2016-12-29 LAB — BASIC METABOLIC PANEL
BUN: 10 mg/dL (ref 4–21)
Creatinine: 1.2 mg/dL (ref <=1.3)
GLUCOSE: 107 mg/dL
Potassium: 5 mmol/L (ref 3.4–5.3)
Sodium: 142 mmol/L (ref 137–147)

## 2016-12-29 LAB — LIPID PANEL
Cholesterol: 147 mg/dL (ref 0–200)
HDL: 56 mg/dL (ref 35–70)
LDL Cholesterol: 76 mg/dL
Triglycerides: 75 mg/dL (ref 40–160)

## 2017-01-03 ENCOUNTER — Telehealth: Payer: Self-pay | Admitting: Family Medicine

## 2017-01-03 NOTE — Telephone Encounter (Signed)
Call patient: Metabolic panel looks okay. Glucose was just borderline elevated but liver and kidney function look good. Total cholesterol, triglycerides HDL and LDL look good. Prostate level is normal.

## 2017-01-04 NOTE — Telephone Encounter (Signed)
LMOM notifying pt of results.  

## 2017-01-08 ENCOUNTER — Encounter: Payer: Self-pay | Admitting: Family Medicine

## 2017-01-08 NOTE — Progress Notes (Unsigned)
12/29/16

## 2017-06-26 ENCOUNTER — Ambulatory Visit: Payer: BLUE CROSS/BLUE SHIELD | Admitting: Family Medicine

## 2017-06-28 ENCOUNTER — Ambulatory Visit (INDEPENDENT_AMBULATORY_CARE_PROVIDER_SITE_OTHER): Payer: BLUE CROSS/BLUE SHIELD | Admitting: Family Medicine

## 2017-06-28 ENCOUNTER — Other Ambulatory Visit: Payer: Self-pay | Admitting: Family Medicine

## 2017-06-28 ENCOUNTER — Encounter: Payer: Self-pay | Admitting: Family Medicine

## 2017-06-28 VITALS — BP 129/79 | HR 64 | Ht 70.0 in | Wt 194.0 lb

## 2017-06-28 DIAGNOSIS — R220 Localized swelling, mass and lump, head: Secondary | ICD-10-CM | POA: Diagnosis not present

## 2017-06-28 DIAGNOSIS — I252 Old myocardial infarction: Secondary | ICD-10-CM

## 2017-06-28 DIAGNOSIS — I10 Essential (primary) hypertension: Secondary | ICD-10-CM

## 2017-06-28 DIAGNOSIS — E785 Hyperlipidemia, unspecified: Secondary | ICD-10-CM | POA: Diagnosis not present

## 2017-06-28 NOTE — Progress Notes (Signed)
Subjective:    Patient ID: Darrell Bridges, male    DOB: April 22, 1956, 61 y.o.   MRN: [id number]  HPI Hypertension- Pt denies chest pain, SOB, dizziness, or heart palpitations.  Taking meds as directed w/o problems.  Denies medication side effects.    Hyperlipidemia- currently on a statin and tolerating well. Lab Results  Component Value Date   CHOL 147 12/29/2016   HDL 56 12/29/2016   LDLCALC 76 12/29/2016   LDLDIRECT 81 11/20/2012   TRIG 75 12/29/2016   CHOLHDL 2.7 07/14/2014     History of MI-  doing well overall. No recent chest pain shortness of breath  He did want to discuss some swelling underneath his eyes bilaterally. He says exam is getting only fax of fluid. 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 He denies any snoring or sleep apnea type symptoms. He denies any itching or burning or allergy type symptoms in the eyes. Cool packs do seem to help at least temporarily.   Review of Systems   BP 129/79   Pulse 64   Ht  (1.778 m)   Wt 194 lb (88 kg)   SpO2 98%   BMI 27.84 kg/m     Allergies  Allergen Reactions  . Lisinopril     Other reaction(s): Cough    Past Medical History:  Diagnosis Date  . CAD (coronary artery disease)   . Hyperlipidemia   . Hypertension   . Nephrolithiasis     No past surgical history on file.  Social History   Social History  . Marital status: Single    Spouse name: N/A  . Number of children: 2  . Years of education: N/A   Occupational History  .  [employer]   Social History Main Topics  . 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 Smokeless tobacco: Never Used  . Alcohol use Yes     Comment: Rarely  . Drug use: No  . Sexual activity: Yes   Other Topics Concern  . Not on file   Social History Narrative  . No narrative on file    Family History  Problem Relation Age of Onset  . Heart attack Father        Died at 68 of MI  . Diabetes Mother   . Hyperlipidemia Unknown   .  Hypertension Mother   . Stroke Unknown   . Thyroid disease Brother     Outpatient Encounter Prescriptions as of 06/28/2017  Medication Sig  . aspirin 325 MG EC tablet Take 325 mg by mouth daily.    Marland Kitchen atorvastatin (LIPITOR) 20 MG tablet   . Krill Oil 500 MG CAPS Take by mouth.  . losartan (COZAAR) 50 MG tablet Take by mouth.  . magnesium gluconate (MAGONATE) 500 MG tablet Take 500 mg by mouth daily.  . metoprolol succinate (TOPROL-XL) 25 MG 24 hr tablet Take 1 tablet (25 mg total) by mouth daily.  . Multiple Vitamin (ONE-A-DAY MENS PO) Take 1 capsule by mouth daily.  . nitroGLYCERIN (NITROSTAT) 0.4 MG SL tablet Place 0.4 mg under the tongue.  . [DISCONTINUED] triamcinolone cream (KENALOG) 0.1 % Apply 1 application topically 2 (two) times daily.   No facility-administered encounter medications on file as of 06/28/2017.           Objective:   Physical Exam  Constitutional: He is oriented to person, place, and time. He appears well-developed and well-nourished.  HENT:  Head: Normocephalic and atraumatic.  He does  have some white swelling just underneath both eyes. No erythema or edema of the upper lids. No discharge.  Eyes: Pupils are equal, round, and reactive to light. Conjunctivae and EOM are normal.  Neck: Neck supple. No thyromegaly present.  Cardiovascular: Normal rate, regular rhythm and normal heart sounds.   No murmur heard. No carotid bruits.  Pulmonary/Chest: Effort normal and breath sounds normal.  Musculoskeletal: He exhibits no edema.  Neurological: He is alert and oriented to person, place, and time.  Skin: Skin is warm and dry.  Psychiatric: He has a normal mood and affect. His behavior is normal.          Assessment & Plan:  HTN - Well controlled. Continue current regimen. Follow up in  28month.   Hyperlipidemia -Up to date. We'll be due for repeat in 6 months. Taking his medications regularly.  History of MI - Asymptomatic. Doing well overall. On his  statin beta blocker and aspirin daily.  Swelling/bags under eyes-likely just part of the aging process and lack of sleep. Gave reassurance. No erythema or abnormal symptoms. Will add a TSH and a CBC though just to be thorough.

## 2017-06-29 LAB — BASIC METABOLIC PANEL
BUN/Creatinine Ratio: 12 (ref 9–20)
BUN: 14 mg/dL (ref 6–24)
CO2: 26 mmol/L (ref 20–29)
Calcium: 9.6 mg/dL (ref 8.7–10.2)
Chloride: 100 mmol/L (ref 96–106)
Creatinine, Ser: 1.14 mg/dL (ref 0.76–1.27)
GFR calc Af Amer: 81 mL/min/{1.73_m2} (ref >59)
GFR, EST NON AFRICAN AMERICAN: 70 mL/min/{1.73_m2} (ref >59)
GLUCOSE: 101 mg/dL — AB (ref 65–99)
POTASSIUM: 4.6 mmol/L (ref 3.5–5.2)
SODIUM: 139 mmol/L (ref 134–144)

## 2017-06-29 LAB — CBC WITH DIFFERENTIAL/PLATELET
BASOS ABS: 0 10*3/uL (ref 0.0–0.2)
Basos: 1 %
EOS (ABSOLUTE): 0.2 10*3/uL (ref 0.0–0.4)
Eos: 3 %
Hematocrit: 40.1 % (ref 37.5–51.0)
Hemoglobin: 13.8 g/dL (ref 13.0–17.7)
Immature Grans (Abs): 0 10*3/uL (ref 0.0–0.1)
Immature Granulocytes: 0 %
LYMPHS ABS: 1.2 10*3/uL (ref 0.7–3.1)
Lymphs: 25 %
MCH: 32.5 pg (ref 26.6–33.0)
MCHC: 34.4 g/dL (ref 31.5–35.7)
MCV: 94 fL (ref 79–97)
MONOS ABS: 0.6 10*3/uL (ref 0.1–0.9)
Monocytes: 13 %
Neutrophils Absolute: 2.9 10*3/uL (ref 1.4–7.0)
Neutrophils: 58 %
PLATELETS: 164 10*3/uL (ref 150–379)
RBC: 4.25 x10E6/uL (ref 4.14–5.80)
RDW: 13.1 % (ref 12.3–15.4)
WBC: 5 10*3/uL (ref 3.4–10.8)

## 2017-06-29 LAB — TSH: TSH: 2.23 u[IU]/mL (ref 0.450–4.500)

## 2017-11-30 LAB — HM COLONOSCOPY

## 2017-12-05 ENCOUNTER — Encounter: Payer: Self-pay | Admitting: Family Medicine

## 2017-12-24 ENCOUNTER — Encounter: Payer: Self-pay | Admitting: Family Medicine

## 2017-12-24 ENCOUNTER — Ambulatory Visit: Payer: BLUE CROSS/BLUE SHIELD | Admitting: Family Medicine

## 2017-12-24 VITALS — BP 129/80 | HR 68 | Ht 70.0 in | Wt 196.0 lb

## 2017-12-24 DIAGNOSIS — I10 Essential (primary) hypertension: Secondary | ICD-10-CM | POA: Diagnosis not present

## 2017-12-24 DIAGNOSIS — Z1159 Encounter for screening for other viral diseases: Secondary | ICD-10-CM | POA: Diagnosis not present

## 2017-12-24 DIAGNOSIS — I2510 Atherosclerotic heart disease of native coronary artery without angina pectoris: Secondary | ICD-10-CM | POA: Diagnosis not present

## 2017-12-24 DIAGNOSIS — Z125 Encounter for screening for malignant neoplasm of prostate: Secondary | ICD-10-CM | POA: Diagnosis not present

## 2017-12-24 DIAGNOSIS — Z833 Family history of diabetes mellitus: Secondary | ICD-10-CM

## 2017-12-24 NOTE — Progress Notes (Signed)
Subjective:    CC: BP  HPI: Hypertension- Pt denies chest pain, SOB, dizziness, or heart palpitations.  Taking meds as directed w/o problems.  Denies medication side effects.    CAD- No Cp or SOB.   His cardiologist increased his lipitor to 40mg .    Would like to be tested for diabetes.    He also complains of some numbness on the medial side of his fourth finger on his left hand.  He was holding a quarter pain for about 2 hours while he is pending his sugars about 2 weeks ago.  He says it was persistently numb after a few days it continued to get a little better.  He wanted to make sure it was not a side effect of the increased dose of the Lipitor.  Past medical history, Surgical history, Family history not pertinant except as noted below, Social history, Allergies, and medications have been entered into the medical record, reviewed, and corrections made.   Review of Systems: No fevers, chills, night sweats, weight loss, chest pain, or shortness of breath.   Objective:    General: Well Developed, well nourished, and in no acute distress.  Neuro: Alert and oriented x3, extra-ocular muscles intact, sensation grossly intact.  HEENT: Normocephalic, atraumatic  Skin: Warm and dry, no rashes. Cardiac: Regular rate and rhythm, no murmurs rubs or gallops, no lower extremity edema.  Respiratory: Clear to auscultation bilaterally. Not using accessory muscles, speaking in full sentences.   Impression and Recommendations:    HTN - Well controlled. Continue current regimen. Follow up in  6 months.   CAD - Well controlled. Continue current regimen. Follow up in  6months.   Now on higher dose of statin.    Fam hx DM - recheck A1`C   Numbness on finger on left hand - seems to be resolving.  likley was mechanical from how he was holding the pain can.  Explained that I do not think this is a side effect of the statin.  It does not usually cause numbness or neuropathy.  Due for PSA screening.    Due Hpe C screening

## 2018-01-01 ENCOUNTER — Other Ambulatory Visit: Payer: Self-pay | Admitting: Family Medicine

## 2018-01-02 LAB — COMPREHENSIVE METABOLIC PANEL
A/G RATIO: 1.8 (ref 1.2–2.2)
ALBUMIN: 4.5 g/dL (ref 3.5–5.5)
ALT: 22 IU/L (ref 0–44)
AST: 23 IU/L (ref 0–40)
Alkaline Phosphatase: 60 IU/L (ref 39–117)
BUN / CREAT RATIO: 11 (ref 9–20)
BUN: 12 mg/dL (ref 6–24)
Bilirubin Total: 0.7 mg/dL (ref 0.0–1.2)
CO2: 25 mmol/L (ref 20–29)
CREATININE: 1.13 mg/dL (ref 0.76–1.27)
Calcium: 9.7 mg/dL (ref 8.7–10.2)
Chloride: 101 mmol/L (ref 96–106)
GFR, EST AFRICAN AMERICAN: 82 mL/min/{1.73_m2} (ref >59)
GFR, EST NON AFRICAN AMERICAN: 71 mL/min/{1.73_m2} (ref >59)
GLOBULIN, TOTAL: 2.5 g/dL (ref 1.5–4.5)
Glucose: 91 mg/dL (ref 65–99)
POTASSIUM: 4.2 mmol/L (ref 3.5–5.2)
SODIUM: 139 mmol/L (ref 134–144)
TOTAL PROTEIN: 7 g/dL (ref 6.0–8.5)

## 2018-01-02 LAB — LIPID PANEL W/O CHOL/HDL RATIO
Cholesterol, Total: 149 mg/dL (ref 100–199)
HDL: 50 mg/dL (ref >39)
LDL Calculated: 77 mg/dL (ref 0–99)
Triglycerides: 109 mg/dL (ref 0–149)
VLDL Cholesterol Cal: 22 mg/dL (ref 5–40)

## 2018-01-02 LAB — PSA: PROSTATE SPECIFIC AG, SERUM: 0.5 ng/mL (ref 0.0–4.0)

## 2018-01-02 LAB — HEPATITIS C ANTIBODY: Hep C Virus Ab: <0.1 s/co ratio (ref 0.0–0.9)

## 2018-01-02 LAB — SPECIMEN STATUS REPORT

## 2018-01-02 LAB — HGB A1C W/O EAG: HEMOGLOBIN A1C: 5.1 % (ref 4.8–5.6)

## 2018-02-04 ENCOUNTER — Other Ambulatory Visit: Payer: Self-pay | Admitting: Family Medicine

## 2018-06-25 ENCOUNTER — Ambulatory Visit: Payer: BLUE CROSS/BLUE SHIELD | Admitting: Family Medicine

## 2018-06-27 ENCOUNTER — Ambulatory Visit (INDEPENDENT_AMBULATORY_CARE_PROVIDER_SITE_OTHER): Payer: BLUE CROSS/BLUE SHIELD | Admitting: Family Medicine

## 2018-06-27 ENCOUNTER — Encounter: Payer: Self-pay | Admitting: Family Medicine

## 2018-06-27 VITALS — BP 131/77 | HR 75 | Ht 70.0 in | Wt 192.0 lb

## 2018-06-27 DIAGNOSIS — I2510 Atherosclerotic heart disease of native coronary artery without angina pectoris: Secondary | ICD-10-CM | POA: Diagnosis not present

## 2018-06-27 DIAGNOSIS — I10 Essential (primary) hypertension: Secondary | ICD-10-CM | POA: Diagnosis not present

## 2018-06-27 MED ORDER — NITROGLYCERIN 0.4 MG SL SUBL: 0.4000 mg | SUBLINGUAL_TABLET | SUBLINGUAL | 11 refills | Status: DC | PRN

## 2018-06-27 MED ORDER — LOSARTAN POTASSIUM 50 MG PO TABS: 50.0000 mg | ORAL_TABLET | Freq: Every day | ORAL | 3 refills | Status: DC

## 2018-06-27 NOTE — Progress Notes (Signed)
Subjective:    CC: BP and CAD  HPI:  Hypertension- Pt denies chest pain, SOB, dizziness, or heart palpitations.  Taking meds as directed w/o problems.  Denies medication side effects.  Has been eating well.   F/U CAD -no recent chest pain or shortness of breath.  Taking medications regularly tolerating statin well. Has stress test scheduled.   Past medical history, Surgical history, Family history not pertinant except as noted below, Social history, Allergies, and medications have been entered into the medical record, reviewed, and corrections made.   Review of Systems: No fevers, chills, night sweats, weight loss, chest pain, or shortness of breath.   Objective:    General: Well Developed, well nourished, and in no acute distress.  Neuro: Alert and oriented x3, extra-ocular muscles intact, sensation grossly intact.  HEENT: Normocephalic, atraumatic  Skin: Warm and dry, no rashes. Cardiac: Regular rate and rhythm, no murmurs rubs or gallops, no lower extremity edema.  Respiratory: Clear to auscultation bilaterally. Not using accessory muscles, speaking in full sentences.   Impression and Recommendations:    CAD -stable.  No symptoms.  On an ARB, beta-blocker and statin and ASA.  Consider switching to low-dose aspirin.  Since he actually has a follow-up with his cardiologist is encouraged him to go ahead and discuss that.  I really feel like he could go down to 81 mg and that would reduce his long-term risk for bleeding.   HTN - Well controlled. Continue current regimen. Follow up in  6 months.

## 2018-07-24 ENCOUNTER — Other Ambulatory Visit: Payer: Self-pay | Admitting: Family Medicine

## 2018-08-05 ENCOUNTER — Telehealth: Payer: Self-pay | Admitting: Family Medicine

## 2018-08-05 NOTE — Telephone Encounter (Signed)
Pt's wife called to see if PCP would be willing to see Pt's mother. She is staying with them until the first of Decemeber and is currently being treated for HTN. Reports she is on medication for this but her readings have been high.   Unsure if Pt should go to UC for eval since she is only here short term, or if [facility] would like to see her. Routing.

## 2018-08-06 NOTE — Telephone Encounter (Signed)
Stanton KidneyDebra advised and transferred to scheduler to make appt.

## 2018-08-06 NOTE — Telephone Encounter (Signed)
I would be happy to see her. We can put on on Friday at 11:30 if they can do that.

## 2018-12-31 ENCOUNTER — Ambulatory Visit (INDEPENDENT_AMBULATORY_CARE_PROVIDER_SITE_OTHER): Payer: BLUE CROSS/BLUE SHIELD | Admitting: Family Medicine

## 2018-12-31 ENCOUNTER — Other Ambulatory Visit: Payer: Self-pay

## 2018-12-31 ENCOUNTER — Encounter: Payer: Self-pay | Admitting: Family Medicine

## 2018-12-31 VITALS — BP 124/81 | HR 55 | Temp 98.2°F | Ht 70.0 in | Wt 194.0 lb

## 2018-12-31 DIAGNOSIS — E785 Hyperlipidemia, unspecified: Secondary | ICD-10-CM

## 2018-12-31 DIAGNOSIS — Z125 Encounter for screening for malignant neoplasm of prostate: Secondary | ICD-10-CM | POA: Diagnosis not present

## 2018-12-31 DIAGNOSIS — I2510 Atherosclerotic heart disease of native coronary artery without angina pectoris: Secondary | ICD-10-CM

## 2018-12-31 DIAGNOSIS — I10 Essential (primary) hypertension: Secondary | ICD-10-CM

## 2018-12-31 MED ORDER — LOSARTAN POTASSIUM 50 MG PO TABS: 50.0000 mg | ORAL_TABLET | Freq: Every day | ORAL | 3 refills | Status: DC

## 2018-12-31 MED ORDER — ATORVASTATIN CALCIUM 40 MG PO TABS: 40.0000 mg | ORAL_TABLET | Freq: Every day | ORAL | 3 refills | Status: DC

## 2018-12-31 MED ORDER — METOPROLOL SUCCINATE ER 25 MG PO TB24: 25.0000 mg | ORAL_TABLET | Freq: Every day | ORAL | 1 refills | Status: DC

## 2018-12-31 NOTE — Progress Notes (Signed)
Virtual Visit via Telephone Note  I connected with Darrell Bridges on 12/31/18 at  4:00 PM EDT by telephone and verified that I am speaking with the correct person using two identifiers.   I discussed the limitations, risks, security and privacy concerns of performing an evaluation and management service by telephone and the availability of in person appointments. I also discussed with the patient that there may be a patient responsible charge related to this service. The patient expressed understanding and agreed to proceed.   Subjective:    CC: F/U BP, 6 mo  HPI:  Hypertension- Pt denies chest pain, SOB, dizziness, or heart palpitations.  Taking meds as directed w/o problems.  Denies medication side effects.    F/U CAD - Now down on baby ASA.    Hyperlipidemia - tolerating lipitor well with no side effects.     Past medical history, Surgical history, Family history not pertinant except as noted below, Social history, Allergies, and medications have been entered into the medical record, reviewed, and corrections made.   Review of Systems: No fevers, chills, night sweats, weight loss, chest pain, or shortness of breath.   Objective:    General: Speaking clearly in complete sentences without any shortness of breath.  Alert and oriented x3.  Normal judgment. No apparent acute distress.    Impression and Recommendations:   HTN -he says his home blood pressures are normally well controlled he checks them about once a month.  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  We will refill medications today.  CAD -only if symptomatic.  Now taking just a baby aspirin.  Hyperlipidemia -doing well on statin due for repeat lipids this summer we will go ahead and print today.  He uses [facility].  Screen prostate -due for PSA screening this summer we will go ahead and plan  his lab work.    I discussed the assessment and treatment plan with the patient. The patient was provided an opportunity to ask questions and all were answered. The patient agreed with the plan and demonstrated an understanding of the instructions.   The patient was advised to call back or seek an in-person evaluation if the symptoms worsen or if the condition fails to improve as anticipated.  I provided 12 minutes of non-face-to-face time during this encounter.   Nani Gasser, MD

## 2019-01-02 ENCOUNTER — Encounter: Payer: Self-pay | Admitting: Family Medicine

## 2019-04-01 ENCOUNTER — Other Ambulatory Visit: Payer: Self-pay | Admitting: Family Medicine

## 2019-04-01 ENCOUNTER — Telehealth: Payer: Self-pay | Admitting: Family Medicine

## 2019-04-01 MED ORDER — VALSARTAN 160 MG PO TABS: 160.0000 mg | ORAL_TABLET | Freq: Every day | ORAL | 1 refills | Status: DC

## 2019-04-01 NOTE — Telephone Encounter (Signed)
Patient notified that med has been changed and sent to pharmacy. Call back number provided if any questions. KG LPN

## 2019-04-01 NOTE — Telephone Encounter (Signed)
Pharmacy sent [place] notificatoin about losartan being on backorder.  Will change to valsartan. New rx sent. Please notify patient.

## 2019-06-05 ENCOUNTER — Telehealth: Payer: Self-pay | Admitting: Family Medicine

## 2019-06-05 NOTE — Telephone Encounter (Signed)
Patient is requesting labs before upcoming appointment. Patient would like a call back once these orders are placed.

## 2019-06-05 NOTE — Telephone Encounter (Signed)
It looks like they were entered in April?  For [facility]?

## 2019-06-05 NOTE — Telephone Encounter (Signed)
Pt advised. He stated that his insurance will pay for him to have the labs done thru [facility]. I advised him that I would fax these for him. Note written on order that pt will have labs done w/[facility]..Davianna Deutschman, Lahoma Crocker, CMA

## 2019-06-07 LAB — COMPLETE METABOLIC PANEL WITHOUT GFR
AG Ratio: 2 (calc) (ref 1.0–2.5)
ALT: 17 U/L (ref 9–46)
AST: 20 U/L (ref 10–35)
Albumin: 4.5 g/dL (ref 3.6–5.1)
Alkaline phosphatase (APISO): 56 U/L (ref 35–144)
BUN: 17 mg/dL (ref 7–25)
CO2: 27 mmol/L (ref 20–32)
Calcium: 9.6 mg/dL (ref 8.6–10.3)
Chloride: 106 mmol/L (ref 98–110)
Creat: 1.04 mg/dL (ref 0.70–1.25)
GFR, Est African American: 89 mL/min/{1.73_m2}
GFR, Est Non African American: 77 mL/min/{1.73_m2}
Globulin: 2.2 g/dL (ref 1.9–3.7)
Glucose, Bld: 109 mg/dL (ref 65–139)
Potassium: 4 mmol/L (ref 3.5–5.3)
Sodium: 142 mmol/L (ref 135–146)
Total Bilirubin: 0.8 mg/dL (ref 0.2–1.2)
Total Protein: 6.7 g/dL (ref 6.1–8.1)

## 2019-06-07 LAB — LIPID PANEL
Cholesterol: 126 mg/dL
HDL: 46 mg/dL
LDL Cholesterol (Calc): 63 mg/dL
Non-HDL Cholesterol (Calc): 80 mg/dL
Total CHOL/HDL Ratio: 2.7 (calc)
Triglycerides: 87 mg/dL

## 2019-06-07 LAB — PSA: PSA: 0.3 ng/mL (ref <=4.0)

## 2019-06-20 ENCOUNTER — Ambulatory Visit: Payer: BLUE CROSS/BLUE SHIELD | Admitting: Family Medicine

## 2019-06-20 ENCOUNTER — Ambulatory Visit: Payer: BC Managed Care – PPO | Admitting: Family Medicine

## 2019-06-20 ENCOUNTER — Encounter: Payer: Self-pay | Admitting: Family Medicine

## 2019-06-20 ENCOUNTER — Other Ambulatory Visit: Payer: Self-pay

## 2019-06-20 VITALS — BP 116/68 | HR 61 | Ht 70.0 in | Wt 194.0 lb

## 2019-06-20 DIAGNOSIS — Z23 Encounter for immunization: Secondary | ICD-10-CM | POA: Diagnosis not present

## 2019-06-20 DIAGNOSIS — I2510 Atherosclerotic heart disease of native coronary artery without angina pectoris: Secondary | ICD-10-CM | POA: Diagnosis not present

## 2019-06-20 DIAGNOSIS — E785 Hyperlipidemia, unspecified: Secondary | ICD-10-CM

## 2019-06-20 DIAGNOSIS — I10 Essential (primary) hypertension: Secondary | ICD-10-CM

## 2019-06-20 NOTE — Assessment & Plan Note (Signed)
Stable.  Asymptomatic.  Plans to follow-up with Dr. Mauricio Po in December.  Says he needs to call or go ahead and make his appointment.

## 2019-06-20 NOTE — Assessment & Plan Note (Signed)
Well controlled. Continue current regimen. Follow up in  6 mo  

## 2019-06-20 NOTE — Assessment & Plan Note (Signed)
LDL at goal based on history of coronary artery disease.

## 2019-06-20 NOTE — Progress Notes (Addendum)
Established Patient Office Visit  Subjective:  Patient ID: Darrell Bridges, male    DOB: [birth year]  Age: 63 y.o. MRN: [id number]  CC:  Chief Complaint  Patient presents with  . Hypertension    HPI Darrell Bridges presents for   Hypertension- Pt denies chest pain, SOB, dizziness, or heart palpitations.  Taking meds as directed w/o problems.  Denies medication side effects.    F/U CAD - No recent CP or SOB.  He is actually doing really well.  He is due for his annual follow-up with Dr. Leeann Must in December.  We just did his lipids and his LDL was at goal less than 70.  Hyperlipidemia - tolerating stating well with no myalgias or significant side effects.  Lab Results  Component Value Date   CHOL 126 06/06/2019   CHOL 149 01/01/2018   CHOL 147 12/29/2016   Lab Results  Component Value Date   HDL 46 06/06/2019   HDL 50 01/01/2018   HDL 56 12/29/2016   Lab Results  Component Value Date   LDLCALC 63 06/06/2019   LDLCALC 77 01/01/2018   LDLCALC 76 12/29/2016   Lab Results  Component Value Date   TRIG 87 06/06/2019   TRIG 109 01/01/2018   TRIG 75 12/29/2016   Lab Results  Component Value Date   CHOLHDL 2.7 06/06/2019   CHOLHDL 2.7 07/14/2014   CHOLHDL 2.9 07/15/2013   Lab Results  Component Value Date   LDLDIRECT 81 11/20/2012     Past Medical History:  Diagnosis Date  . CAD (coronary artery disease)   . Hyperlipidemia   . Hypertension   . Nephrolithiasis     No past surgical history on file.  Family History  Problem Relation Age of Onset  . Heart attack Father        Died at 67 of MI  . Diabetes Mother   . Hyperlipidemia Unknown   . Hypertension Mother   . Stroke Unknown   . Thyroid disease Brother     Social History   Socioeconomic History  . Marital status: Single    Spouse name: Not on file  . Number of children: 2  . Years of education: Not on file  . Highest education level: Not on file  Occupational History    Employer: [employer]  Social Needs  . Financial resource strain: Not on file  . Food insecurity    Worry: Not on file    Inability: Not on file  . Transportation needs    Medical: Not on file    Non-medical: Not on file  Tobacco Use  . 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 Smokeless tobacco: Never Used  Substance and Sexual Activity  . Alcohol use: Yes    Comment: Rarely  . Drug use: No  . Sexual activity: Yes  Lifestyle  . Physical activity    Days per week: Not on file    Minutes per session: Not on file  . Stress: Not on file  Relationships  . Social Musician on phone: Not on file    Gets together: Not on file    Attends religious service: Not on file    Active member of club or organization: Not on file    Attends meetings of clubs or organizations: Not on file    Relationship status: Not on file  . Intimate partner violence    Fear of current or ex partner: Not on file    Emotionally  abused: Not on file    Physically abused: Not on file    Forced sexual activity: Not on file  Other Topics Concern  . Not on file  Social History Narrative  . Not on file    Outpatient Medications Prior to Visit  Medication Sig Dispense Refill  . aspirin EC 81 MG tablet Take 81 mg by mouth daily.    Marland Kitchen. atorvastatin (LIPITOR) 40 MG tablet Take 1 tablet (40 mg total) by mouth at bedtime. 90 tablet 3  . Krill Oil 500 MG CAPS Take by mouth.    . losartan (COZAAR) 50 MG tablet Take 1 tablet (50 mg total) by mouth daily. 90 tablet 3  . magnesium gluconate (MAGONATE) 500 MG tablet Take 500 mg by mouth daily.    . metoprolol succinate (TOPROL-XL) 25 MG 24 hr tablet Take 1 tablet (25 mg total) by mouth daily. 90 tablet 1  . Multiple Vitamin (ONE-A-DAY MENS PO) Take 1 capsule by mouth daily.    . nitroGLYCERIN (NITROSTAT) 0.4 MG SL tablet Place 1 tablet (0.4 mg total) under the tongue every 5 (five) minutes as needed for chest pain. 25 tablet 11  . valsartan (DIOVAN) 160 MG tablet Take 1 tablet  (160 mg total) by mouth daily. 90 tablet 1   No facility-administered medications prior to visit.     Allergies  Allergen Reactions  . Lisinopril     Other reaction(s): Cough    ROS Review of Systems    Objective:    Physical Exam  Constitutional: He is oriented to person, place, and time. He appears well-developed and well-nourished.  HENT:  Head: Normocephalic and atraumatic.  Cardiovascular: Normal rate, regular rhythm and normal heart sounds.  Pulmonary/Chest: Effort normal and breath sounds normal.  Neurological: He is alert and oriented to person, place, and time.  Skin: Skin is warm and dry.  Psychiatric: He has a normal mood and affect. His behavior is normal.    BP 116/68   Pulse 61   Ht 5\' 10"  (1.778 m)   Wt 194 lb (88 kg)   SpO2 100%   BMI 27.84 kg/m  Wt Readings from Last 3 Encounters:  06/20/19 194 lb (88 kg)  12/31/18 194 lb (88 kg)  06/27/18 192 lb (87.1 kg)     There are no preventive care reminders to display for this patient.  There are no preventive care reminders to display for this patient.  Lab Results  Component Value Date   TSH 2.230 06/28/2017   Lab Results  Component Value Date   WBC 5.0 06/28/2017   HGB 13.8 06/28/2017   HCT 40.1 06/28/2017   MCV 94 06/28/2017   PLT 164 06/28/2017   Lab Results  Component Value Date   NA 142 06/06/2019   K 4.0 06/06/2019   CO2 27 06/06/2019   GLUCOSE 109 06/06/2019   BUN 17 06/06/2019   CREATININE 1.04 06/06/2019   BILITOT 0.8 06/06/2019   ALKPHOS 60 01/01/2018   AST 20 06/06/2019   ALT 17 06/06/2019   PROT 6.7 06/06/2019   ALBUMIN 4.5 01/01/2018   CALCIUM 9.6 06/06/2019   Lab Results  Component Value Date   CHOL 126 06/06/2019   Lab Results  Component Value Date   HDL 46 06/06/2019   Lab Results  Component Value Date   LDLCALC 63 06/06/2019   Lab Results  Component Value Date   TRIG 87 06/06/2019   Lab Results  Component Value Date   CHOLHDL 2.7  06/06/2019   Lab  Results  Component Value Date   HGBA1C 5.1 01/01/2018      Assessment & Plan:   Problem List Items Addressed This Visit      Cardiovascular and Mediastinum   Hypertension - Primary    Well controlled. Continue current regimen. Follow up in  6 mo      Coronary artery disease    Stable.  Asymptomatic.  Plans to follow-up with Dr. Mauricio Po in December.  Says he needs to call or go ahead and make his appointment.        Other   Hyperlipidemia LDL goal <70    LDL at goal based on history of coronary artery disease.        We reviewed his labs today.  He had some questions about kidney function as his brother was recently diagnosed with some kidney problems.  No orders of the defined types were placed in this encounter.   Follow-up: No follow-ups on file.    Beatrice Lecher, MD

## 2019-06-28 ENCOUNTER — Other Ambulatory Visit: Payer: Self-pay | Admitting: Family Medicine

## 2019-06-28 DIAGNOSIS — I10 Essential (primary) hypertension: Secondary | ICD-10-CM

## 2019-12-23 ENCOUNTER — Ambulatory Visit: Payer: BC Managed Care – PPO | Admitting: Family Medicine

## 2019-12-23 ENCOUNTER — Other Ambulatory Visit: Payer: Self-pay

## 2019-12-23 ENCOUNTER — Encounter: Payer: Self-pay | Admitting: Family Medicine

## 2019-12-23 VITALS — BP 121/77 | HR 63 | Ht 70.08 in | Wt 198.0 lb

## 2019-12-23 DIAGNOSIS — G245 Blepharospasm: Secondary | ICD-10-CM

## 2019-12-23 DIAGNOSIS — I252 Old myocardial infarction: Secondary | ICD-10-CM | POA: Diagnosis not present

## 2019-12-23 DIAGNOSIS — I10 Essential (primary) hypertension: Secondary | ICD-10-CM

## 2019-12-23 DIAGNOSIS — E785 Hyperlipidemia, unspecified: Secondary | ICD-10-CM

## 2019-12-23 NOTE — Assessment & Plan Note (Signed)
Doing well.  Symptomatic.  Continue beta-blocker, statin and aspirin.

## 2019-12-23 NOTE — Assessment & Plan Note (Signed)
Well controlled. Continue current regimen. Follow up in  6 months.  

## 2019-12-23 NOTE — Progress Notes (Signed)
Established Patient Office Visit  Subjective:  Patient ID: Darrell Bridges, male    DOB: [birth year]  Age: 64 y.o. MRN: [id number]  CC: No chief complaint on file.   HPI Darrell Bridges presents for 6 mo f/u  Hypertension- Pt denies chest pain, SOB, dizziness, or heart palpitations.  Taking meds as directed w/o problems.  Denies medication side effects.    He has had some twitching in his right eye on and off for the last 3 weeks.  No change in vision.  No pain with a spasming.  He just wanted to make sure it was no sign of any type of deficiency.   Past Medical History:  Diagnosis Date  . CAD (coronary artery disease)   . Hyperlipidemia   . Hypertension   . Nephrolithiasis     No past surgical history on file.  Family History  Problem Relation Age of Onset  . Heart attack Father        Died at 73 of MI  . Diabetes Mother   . Hyperlipidemia Unknown   . Hypertension Mother   . Stroke Unknown   . Thyroid disease Brother     Social History   Socioeconomic History  . Marital status: Single    Spouse name: Not on file  . Number of children: 2  . Years of education: Not on file  . Highest education level: Not on file  Occupational History    Employer: [employer]  Tobacco Use  . Smoking status: Former Research scientist (life sciences)  . Smokeless tobacco: Never Used  Substance and Sexual Activity  . Alcohol use: Yes    Comment: Rarely  . Drug use: No  . Sexual activity: Yes  Other Topics Concern  . Not on file  Social History Narrative  . Not on file   Social Determinants of Health   Financial Resource Strain:   . Difficulty of Paying Living Expenses:   Food Insecurity:   . Worried About Charity fundraiser in the Last Year:   . Arboriculturist in the Last Year:   Transportation Needs:   . Film/video editor (Medical):   Marland Kitchen Lack of Transportation (Non-Medical):   Physical Activity:   . Days of Exercise per Week:   . Minutes of Exercise per Session:   Stress:   . Feeling  of Stress :   Social Connections:   . Frequency of Communication with Friends and Family:   . Frequency of Social Gatherings with Friends and Family:   . Attends Religious Services:   . Active Member of Clubs or Organizations:   . Attends Archivist Meetings:   Marland Kitchen Marital Status:   Intimate Partner Violence:   . Fear of Current or Ex-Partner:   . Emotionally Abused:   Marland Kitchen Physically Abused:   . Sexually Abused:     Outpatient Medications Prior to Visit  Medication Sig Dispense Refill  . aspirin EC 81 MG tablet Take 81 mg by mouth daily.    Marland Kitchen atorvastatin (LIPITOR) 40 MG tablet Take 1 tablet (40 mg total) by mouth at bedtime. 90 tablet 3  . Krill Oil 500 MG CAPS Take by mouth.    . losartan (COZAAR) 50 MG tablet Take 1 tablet (50 mg total) by mouth daily. 90 tablet 3  . magnesium gluconate (MAGONATE) 500 MG tablet Take 500 mg by mouth daily.    . metoprolol succinate (TOPROL-XL) 25 MG 24 hr tablet TAKE 1 TABLET BY MOUTH EVERY DAY  90 tablet 1  . Multiple Vitamin (ONE-A-DAY MENS PO) Take 1 capsule by mouth daily.    . nitroGLYCERIN (NITROSTAT) 0.4 MG SL tablet Place 1 tablet (0.4 mg total) under the tongue every 5 (five) minutes as needed for chest pain. 25 tablet 11   No facility-administered medications prior to visit.    Allergies  Allergen Reactions  . Lisinopril     Other reaction(s): Cough    ROS Review of Systems    Objective:    Physical Exam  Constitutional: He is oriented to person, place, and time. He appears well-developed and well-nourished.  HENT:  Head: Normocephalic and atraumatic.  Cardiovascular: Normal rate, regular rhythm and normal heart sounds.  Pulmonary/Chest: Effort normal and breath sounds normal.  Neurological: He is alert and oriented to person, place, and time.  Skin: Skin is warm and dry.  Psychiatric: He has a normal mood and affect. His behavior is normal.    BP 121/77 (BP Location: Left Arm, Patient Position: Sitting, Cuff Size:  Normal)   Pulse 63   Ht 5' 10.08" (1.78 m)   Wt 198 lb (89.8 kg)   SpO2 99%   BMI 28.35 kg/m  Wt Readings from Last 3 Encounters:  12/23/19 198 lb (89.8 kg)  06/20/19 194 lb (88 kg)  12/31/18 194 lb (88 kg)     Health Maintenance Due  Topic Date Due  . HIV Screening  Never done    There are no preventive care reminders to display for this patient.  Lab Results  Component Value Date   TSH 2.230 06/28/2017   Lab Results  Component Value Date   WBC 5.0 06/28/2017   HGB 13.8 06/28/2017   HCT 40.1 06/28/2017   MCV 94 06/28/2017   PLT 164 06/28/2017   Lab Results  Component Value Date   NA 142 06/06/2019   K 4.0 06/06/2019   CO2 27 06/06/2019   GLUCOSE 109 06/06/2019   BUN 17 06/06/2019   CREATININE 1.04 06/06/2019   BILITOT 0.8 06/06/2019   ALKPHOS 60 01/01/2018   AST 20 06/06/2019   ALT 17 06/06/2019   PROT 6.7 06/06/2019   ALBUMIN 4.5 01/01/2018   CALCIUM 9.6 06/06/2019   Lab Results  Component Value Date   CHOL 126 06/06/2019   Lab Results  Component Value Date   HDL 46 06/06/2019   Lab Results  Component Value Date   LDLCALC 63 06/06/2019   Lab Results  Component Value Date   TRIG 87 06/06/2019   Lab Results  Component Value Date   CHOLHDL 2.7 06/06/2019   Lab Results  Component Value Date   HGBA1C 5.1 01/01/2018      Assessment & Plan:   Problem List Items Addressed This Visit      Cardiovascular and Mediastinum   Hypertension - Primary    Well controlled. Continue current regimen. Follow up in  6 months.        Relevant Orders   BASIC METABOLIC PANEL WITH GFR     Other   Hyperlipidemia LDL goal <70    DL at goal back in September.  Plan to recheck in 6 months.      History of MI (myocardial infarction)    Doing well.  Symptomatic.  Continue beta-blocker, statin and aspirin.       Other Visit Diagnoses    Blepharospasm         Blepharospasm-gave reassurance that this is benign condition usually resolves on its own.  There are medications that symptoms can help if it continues just let us know.  Did ask about the Covid vaccine is interested in getting the [employer] because of his travel with his work.  No orders of the defined types were placed in this encounter.   Follow-up: Return in about 6 months (around 06/24/2020) for Hypertension and full fasting labs. Nani Gasser, MD

## 2019-12-23 NOTE — Assessment & Plan Note (Signed)
DL at goal back in September.  Plan to recheck in 6 months.

## 2019-12-24 LAB — BASIC METABOLIC PANEL WITH GFR
BUN: 20 mg/dL (ref 7–25)
CO2: 30 mmol/L (ref 20–32)
Calcium: 9.6 mg/dL (ref 8.6–10.3)
Chloride: 106 mmol/L (ref 98–110)
Creat: 1.13 mg/dL (ref 0.70–1.25)
GFR, Est African American: 81 mL/min/{1.73_m2} (ref >=60)
GFR, Est Non African American: 70 mL/min/{1.73_m2} (ref >=60)
Glucose, Bld: 87 mg/dL (ref 65–99)
Potassium: 5.2 mmol/L (ref 3.5–5.3)
Sodium: 140 mmol/L (ref 135–146)

## 2019-12-24 NOTE — Progress Notes (Signed)
All labs are normal. 

## 2020-01-01 ENCOUNTER — Other Ambulatory Visit: Payer: Self-pay | Admitting: Family Medicine

## 2020-01-01 DIAGNOSIS — I10 Essential (primary) hypertension: Secondary | ICD-10-CM

## 2020-06-29 ENCOUNTER — Ambulatory Visit (INDEPENDENT_AMBULATORY_CARE_PROVIDER_SITE_OTHER): Payer: BC Managed Care – PPO | Admitting: Family Medicine

## 2020-06-29 ENCOUNTER — Encounter: Payer: Self-pay | Admitting: Family Medicine

## 2020-06-29 VITALS — BP 113/68 | HR 59 | Temp 98.2°F | Wt 189.1 lb

## 2020-06-29 DIAGNOSIS — I10 Essential (primary) hypertension: Secondary | ICD-10-CM | POA: Diagnosis not present

## 2020-06-29 DIAGNOSIS — Z23 Encounter for immunization: Secondary | ICD-10-CM

## 2020-06-29 DIAGNOSIS — Z125 Encounter for screening for malignant neoplasm of prostate: Secondary | ICD-10-CM

## 2020-06-29 DIAGNOSIS — I2510 Atherosclerotic heart disease of native coronary artery without angina pectoris: Secondary | ICD-10-CM | POA: Diagnosis not present

## 2020-06-29 DIAGNOSIS — R059 Cough, unspecified: Secondary | ICD-10-CM

## 2020-06-29 DIAGNOSIS — E785 Hyperlipidemia, unspecified: Secondary | ICD-10-CM

## 2020-06-29 MED ORDER — METOPROLOL SUCCINATE ER 25 MG PO TB24: 25.0000 mg | ORAL_TABLET | Freq: Every day | ORAL | 1 refills | Status: DC

## 2020-06-29 MED ORDER — LOSARTAN POTASSIUM 50 MG PO TABS: 25.0000 mg | ORAL_TABLET | Freq: Two times a day (BID) | ORAL | 1 refills | Status: DC

## 2020-06-29 MED ORDER — NITROGLYCERIN 0.4 MG SL SUBL: 0.4000 mg | SUBLINGUAL_TABLET | SUBLINGUAL | 1 refills | Status: AC | PRN

## 2020-06-29 MED ORDER — ATORVASTATIN CALCIUM 40 MG PO TABS: ORAL_TABLET | ORAL | 3 refills | Status: DC

## 2020-06-29 MED ORDER — ESZOPICLONE 2 MG PO TABS: 2.0000 mg | ORAL_TABLET | Freq: Every evening | ORAL | 0 refills | Status: DC | PRN

## 2020-06-29 NOTE — Assessment & Plan Note (Signed)
Currently tolerating statin well without any side effects. Due to recheck lipid panel.

## 2020-06-29 NOTE — Progress Notes (Signed)
Established Patient Office Visit  Subjective:  Patient ID: Darrell Bridges, male    DOB: 06/16/1956  Age: 64 y.o. MRN: [id number]  CC:  Chief Complaint  Patient presents with  . Blood Pressure Check    HPI Darrell HackerDaniel Old presents for   Hypertension- Pt denies chest pain, SOB, dizziness, or heart palpitations.  Taking meds as directed w/o problems.  Denies medication side effects. He feels like he is doing well and still eating healthy and staying very active in fact more recently he has been trying to lose some weight. He is now down to 189 pounds. He has noticed that in fact his blood pressure has been much better.  He is pretty sure that he may have had Covid back in July he said he never went to get formally tested but developed respiratory symptoms including cough. He says he still has a residual dry cough today but is been gradually getting better ever since then. He says initially his blood pressure dropped very low after having had Covid with systolics in the low 100s. He says it is gotten a little bit better but he is definitely noticed lower blood pressures in general.    Past Medical History:  Diagnosis Date  . CAD (coronary artery disease)   . Hyperlipidemia   . Hypertension   . Nephrolithiasis     No past surgical history on file.  Family History  Problem Relation Age of Onset  . Heart attack Father        Died at 4050 of MI  . Diabetes Mother   . Hyperlipidemia Unknown   . Hypertension Mother   . Stroke Unknown   . Thyroid disease Brother     Social History   Socioeconomic History  . Marital status: Single    Spouse name: Not on file  . Number of children: 2  . Years of education: Not on file  . Highest education level: Not on file  Occupational History    Employer: [employer]  Tobacco Use  . Smoking status: Former Games developermoker  . Smokeless tobacco: Never Used  Substance and Sexual Activity  . Alcohol use: Yes    Comment: Rarely  . Drug use: No  .  Sexual activity: Yes  Other Topics Concern  . Not on file  Social History Narrative  . Not on file   Social Determinants of Health   Financial Resource Strain:   . Difficulty of Paying Living Expenses: Not on file  Food Insecurity:   . Worried About Programme researcher, broadcasting/film/videounning Out of Food in the Last Year: Not on file  . Ran Out of Food in the Last Year: Not on file  Transportation Needs:   . Lack of Transportation (Medical): Not on file  . Lack of Transportation (Non-Medical): Not on file  Physical Activity:   . Days of Exercise per Week: Not on file  . Minutes of Exercise per Session: Not on file  Stress:   . Feeling of Stress : Not on file  Social Connections:   . Frequency of Communication with Friends and Family: Not on file  . Frequency of Social Gatherings with Friends and Family: Not on file  . Attends Religious Services: Not on file  . Active Member of Clubs or Organizations: Not on file  . Attends BankerClub or Organization Meetings: Not on file  . Marital Status: Not on file  Intimate Partner Violence:   . Fear of Current or Ex-Partner: Not on file  . Emotionally Abused:  Not on file  . Physically Abused: Not on file  . Sexually Abused: Not on file    Outpatient Medications Prior to Visit  Medication Sig Dispense Refill  . aspirin EC 81 MG tablet Take 81 mg by mouth daily.    Boris Lown Oil 500 MG CAPS Take by mouth.    . magnesium gluconate (MAGONATE) 500 MG tablet Take 500 mg by mouth daily.    . Multiple Vitamin (ONE-A-DAY MENS PO) Take 1 capsule by mouth daily.    Marland Kitchen atorvastatin (LIPITOR) 40 MG tablet TAKE 1 TABLET BY MOUTH EVERYDAY AT BEDTIME 90 tablet 1  . losartan (COZAAR) 50 MG tablet Take 1 tablet (50 mg total) by mouth daily. 90 tablet 3  . metoprolol succinate (TOPROL-XL) 25 MG 24 hr tablet TAKE 1 TABLET BY MOUTH EVERY DAY 90 tablet 1  . nitroGLYCERIN (NITROSTAT) 0.4 MG SL tablet Place 1 tablet (0.4 mg total) under the tongue every 5 (five) minutes as needed for chest pain. 25  tablet 11   No facility-administered medications prior to visit.    Allergies  Allergen Reactions  . Lisinopril     Other reaction(s): Cough    ROS Review of Systems    Objective:    Physical Exam Constitutional:      Appearance: He is well-developed.  HENT:     Head: Normocephalic and atraumatic.     Right Ear: External ear normal.     Left Ear: External ear normal.     Nose: Nose normal.     Mouth/Throat:     Pharynx: No posterior oropharyngeal erythema.  Eyes:     Conjunctiva/sclera: Conjunctivae normal.     Pupils: Pupils are equal, round, and reactive to light.  Neck:     Thyroid: No thyromegaly.  Cardiovascular:     Rate and Rhythm: Normal rate.     Heart sounds: Normal heart sounds.  Pulmonary:     Effort: Pulmonary effort is normal.     Breath sounds: Normal breath sounds.  Musculoskeletal:     Cervical back: Neck supple.  Lymphadenopathy:     Cervical: No cervical adenopathy.  Skin:    General: Skin is warm and dry.  Neurological:     Mental Status: He is alert and oriented to person, place, and time.  Psychiatric:        Mood and Affect: Mood normal.     BP 113/68 (BP Location: Left Arm, Patient Position: Sitting, Cuff Size: Normal)   Pulse (!) 59   Temp 98.2 F (36.8 C) (Oral)   Wt 189 lb 1.3 oz (85.8 kg)   SpO2 99%   BMI 27.07 kg/m  Wt Readings from Last 3 Encounters:  06/29/20 189 lb 1.3 oz (85.8 kg)  12/23/19 198 lb (89.8 kg)  06/20/19 194 lb (88 kg)     There are no preventive care reminders to display for this patient.  There are no preventive care reminders to display for this patient.  Lab Results  Component Value Date   TSH 2.230 06/28/2017   Lab Results  Component Value Date   WBC 5.0 06/28/2017   HGB 13.8 06/28/2017   HCT 40.1 06/28/2017   MCV 94 06/28/2017   PLT 164 06/28/2017   Lab Results  Component Value Date   NA 140 12/23/2019   K 5.2 12/23/2019   CO2 30 12/23/2019   GLUCOSE 87 12/23/2019   BUN 20  12/23/2019   CREATININE 1.13 12/23/2019   BILITOT 0.8 06/06/2019  ALKPHOS 60 01/01/2018   AST 20 06/06/2019   ALT 17 06/06/2019   PROT 6.7 06/06/2019   ALBUMIN 4.5 01/01/2018   CALCIUM 9.6 12/23/2019   Lab Results  Component Value Date   CHOL 126 06/06/2019   Lab Results  Component Value Date   HDL 46 06/06/2019   Lab Results  Component Value Date   LDLCALC 63 06/06/2019   Lab Results  Component Value Date   TRIG 87 06/06/2019   Lab Results  Component Value Date   CHOLHDL 2.7 06/06/2019   Lab Results  Component Value Date   HGBA1C 5.1 01/01/2018      Assessment & Plan:   Problem List Items Addressed This Visit      Cardiovascular and Mediastinum   Hypertension    Blood pressure looks phenomenal today it sounds like he has had some lower blood pressure so I want him to keep an eye on this if he is seeing quite a few systolics under 110 that I think he needs to decrease the losartan down to 25 mg or recently he has been taking 25 twice a day for total of 50 mg. If the blood pressures are staying most consistently under 130 systolic on the 25 mg and he can just stay at that dose. If he continues to lose weight then we will need to monitor this carefully.      Relevant Medications   losartan (COZAAR) 50 MG tablet   nitroGLYCERIN (NITROSTAT) 0.4 MG SL tablet   metoprolol succinate (TOPROL-XL) 25 MG 24 hr tablet   atorvastatin (LIPITOR) 40 MG tablet   Coronary artery disease    Asymptomatic. Followed by Dr. Vilinda Boehringer. In fact he just had a stress test earlier in September 10 and did well. He did notice a little bit more shortness of breath towards the end of the test since having had Covid but was also wearing a mask so just was not sure.      Relevant Medications   losartan (COZAAR) 50 MG tablet   nitroGLYCERIN (NITROSTAT) 0.4 MG SL tablet   metoprolol succinate (TOPROL-XL) 25 MG 24 hr tablet   atorvastatin (LIPITOR) 40 MG tablet     Other   Hyperlipidemia  LDL goal <70    Currently tolerating statin well without any side effects. Due to recheck lipid panel.      Relevant Medications   losartan (COZAAR) 50 MG tablet   nitroGLYCERIN (NITROSTAT) 0.4 MG SL tablet   metoprolol succinate (TOPROL-XL) 25 MG 24 hr tablet   atorvastatin (LIPITOR) 40 MG tablet    Other Visit Diagnoses    Essential hypertension    -  Primary   Relevant Medications   losartan (COZAAR) 50 MG tablet   nitroGLYCERIN (NITROSTAT) 0.4 MG SL tablet   metoprolol succinate (TOPROL-XL) 25 MG 24 hr tablet   atorvastatin (LIPITOR) 40 MG tablet   Other Relevant Orders   COMPLETE METABOLIC PANEL WITH GFR   Lipid panel   CBC   Need for influenza vaccination       Relevant Orders   Flu Vaccine QUAD 6+ mos PF IM (Fluarix Quad PF) (Completed)   Screening for prostate cancer       Relevant Orders   PSA   Cough         Cough status post upper respiratory infection-likely viral possibly Covid. He does feel like it is gradually gotten better over the last couple of months. Lung exam is clear today if symptoms persist  or worsen then encouraged him to please let me know did encourage him to start exercising and walking more regularly to build up his pulmonary endurance.  Meds ordered this encounter  Medications  . DISCONTD: eszopiclone (LUNESTA) 2 MG TABS tablet    Sig: Take 1 tablet (2 mg total) by mouth at bedtime as needed for sleep. Take immediately before bedtime    Dispense:  30 tablet    Refill:  0  . losartan (COZAAR) 50 MG tablet    Sig: Take 0.5 tablets (25 mg total) by mouth in the morning and at bedtime.    Dispense:  180 tablet    Refill:  1  . nitroGLYCERIN (NITROSTAT) 0.4 MG SL tablet    Sig: Place 1 tablet (0.4 mg total) under the tongue every 5 (five) minutes as needed for chest pain.    Dispense:  20 tablet    Refill:  1  . metoprolol succinate (TOPROL-XL) 25 MG 24 hr tablet    Sig: Take 1 tablet (25 mg total) by mouth daily.    Dispense:  90 tablet     Refill:  1  . atorvastatin (LIPITOR) 40 MG tablet    Sig: TAKE 1 TABLET BY MOUTH EVERYDAY AT BEDTIME    Dispense:  90 tablet    Refill:  3    Note: Lunesta was sent in error. Pharmacy contacted and canceled.  Follow-up: Return in about 6 months (around 12/28/2020).    Nani Gasser, MD

## 2020-06-29 NOTE — Assessment & Plan Note (Signed)
Blood pressure looks phenomenal today it sounds like he has had some lower blood pressure so I want him to keep an eye on this if he is seeing quite a few systolics under 110 that I think he needs to decrease the losartan down to 25 mg or recently he has been taking 25 twice a day for total of 50 mg. If the blood pressures are staying most consistently under 130 systolic on the 25 mg and he can just stay at that dose. If he continues to lose weight then we will need to monitor this carefully.

## 2020-06-29 NOTE — Assessment & Plan Note (Signed)
Asymptomatic. Followed by Dr. Vilinda Boehringer. In fact he just had a stress test earlier in September 10 and did well. He did notice a little bit more shortness of breath towards the end of the test since having had Covid but was also wearing a mask so just was not sure.

## 2020-07-09 LAB — COMPLETE METABOLIC PANEL WITH GFR
AG Ratio: 2.1 (calc) (ref 1.0–2.5)
ALT: 21 U/L (ref 9–46)
AST: 22 U/L (ref 10–35)
Albumin: 4.4 g/dL (ref 3.6–5.1)
Alkaline phosphatase (APISO): 58 U/L (ref 35–144)
BUN: 16 mg/dL (ref 7–25)
CO2: 30 mmol/L (ref 20–32)
Calcium: 9.5 mg/dL (ref 8.6–10.3)
Chloride: 103 mmol/L (ref 98–110)
Creat: 1.19 mg/dL (ref 0.70–1.25)
GFR, Est African American: 75 mL/min/{1.73_m2} (ref >=60)
GFR, Est Non African American: 65 mL/min/{1.73_m2} (ref >=60)
Globulin: 2.1 g/dL (calc) (ref 1.9–3.7)
Glucose, Bld: 104 mg/dL — ABNORMAL HIGH (ref 65–99)
Potassium: 4.6 mmol/L (ref 3.5–5.3)
Sodium: 138 mmol/L (ref 135–146)
Total Bilirubin: 0.6 mg/dL (ref 0.2–1.2)
Total Protein: 6.5 g/dL (ref 6.1–8.1)

## 2020-07-09 LAB — CBC
HCT: 38.5 % (ref 38.5–50.0)
Hemoglobin: 12.8 g/dL — ABNORMAL LOW (ref 13.2–17.1)
MCH: 32.5 pg (ref 27.0–33.0)
MCHC: 33.2 g/dL (ref 32.0–36.0)
MCV: 97.7 fL (ref 80.0–100.0)
MPV: 11.9 fL (ref 7.5–12.5)
Platelets: 138 10*3/uL — ABNORMAL LOW (ref 140–400)
RBC: 3.94 10*6/uL — ABNORMAL LOW (ref 4.20–5.80)
RDW: 12.3 % (ref 11.0–15.0)
WBC: 4 10*3/uL (ref 3.8–10.8)

## 2020-07-09 LAB — LIPID PANEL W/REFLEX DIRECT LDL
Cholesterol: 121 mg/dL (ref <200)
HDL: 47 mg/dL (ref >=40)
LDL Cholesterol (Calc): 59 mg/dL (calc)
Non-HDL Cholesterol (Calc): 74 mg/dL (calc) (ref <130)
Total CHOL/HDL Ratio: 2.6 (calc) (ref <5.0)
Triglycerides: 71 mg/dL (ref <150)

## 2020-07-09 LAB — TSH+FREE T4: TSH W/REFLEX TO FT4: 3.14 mIU/L (ref 0.40–4.50)

## 2020-07-09 LAB — PSA: PSA: 0.38 ng/mL (ref <=4.0)

## 2020-07-14 ENCOUNTER — Other Ambulatory Visit: Payer: Self-pay

## 2020-07-14 DIAGNOSIS — D649 Anemia, unspecified: Secondary | ICD-10-CM

## 2020-07-14 NOTE — Progress Notes (Signed)
Ordered labs per result notes.  °

## 2020-07-25 ENCOUNTER — Telehealth: Payer: Self-pay | Admitting: Family Medicine

## 2020-07-25 NOTE — Telephone Encounter (Signed)
Please call for colonoscopy reports.

## 2020-07-26 ENCOUNTER — Other Ambulatory Visit: Payer: Self-pay | Admitting: *Deleted

## 2020-07-26 DIAGNOSIS — D649 Anemia, unspecified: Secondary | ICD-10-CM

## 2020-07-26 LAB — FERRITIN

## 2020-07-26 LAB — CBC WITH DIFFERENTIAL/PLATELET
Absolute Monocytes: 447 cells/uL (ref 200–950)
Basophils Absolute: 61 cells/uL (ref 0–200)
Basophils Relative: 1.3 %
Eosinophils Absolute: 118 cells/uL (ref 15–500)
Eosinophils Relative: 2.5 %
HCT: 37.9 % — ABNORMAL LOW (ref 38.5–50.0)
Hemoglobin: 12.4 g/dL — ABNORMAL LOW (ref 13.2–17.1)
Lymphs Abs: 968 cells/uL (ref 850–3900)
MCH: 32.2 pg (ref 27.0–33.0)
MCHC: 32.7 g/dL (ref 32.0–36.0)
MCV: 98.4 fL (ref 80.0–100.0)
MPV: 12.4 fL (ref 7.5–12.5)
Monocytes Relative: 9.5 %
Neutro Abs: 3107 cells/uL (ref 1500–7800)
Neutrophils Relative %: 66.1 %
Platelets: 137 10*3/uL — ABNORMAL LOW (ref 140–400)
RBC: 3.85 10*6/uL — ABNORMAL LOW (ref 4.20–5.80)
RDW: 12.2 % (ref 11.0–15.0)
Total Lymphocyte: 20.6 %
WBC: 4.7 10*3/uL (ref 3.8–10.8)

## 2020-07-26 LAB — IRON

## 2020-07-26 NOTE — Telephone Encounter (Signed)
There is a colonoscopy scanned in the pt's chart from 2019 with advice to repeat in 10 years.  Has the pt had another colonoscopy since 2019?  Please advise.  Tiajuana Amass, CMA

## 2020-07-27 ENCOUNTER — Other Ambulatory Visit: Payer: Self-pay | Admitting: *Deleted

## 2020-07-27 DIAGNOSIS — D649 Anemia, unspecified: Secondary | ICD-10-CM

## 2020-07-29 LAB — IRON: Iron: 96 ug/dL (ref 50–180)

## 2020-07-29 LAB — FERRITIN: Ferritin: 20 ng/mL — ABNORMAL LOW (ref 24–380)

## 2020-08-09 ENCOUNTER — Other Ambulatory Visit: Payer: Self-pay | Admitting: *Deleted

## 2020-08-09 DIAGNOSIS — D649 Anemia, unspecified: Secondary | ICD-10-CM

## 2020-08-09 LAB — POC HEMOCCULT BLD/STL (HOME/3-CARD/SCREEN)
Card #2 Fecal Occult Blod, POC: NEGATIVE
Card #3 Fecal Occult Blood, POC: NEGATIVE
Fecal Occult Blood, POC: NEGATIVE

## 2020-08-10 ENCOUNTER — Encounter: Payer: Self-pay | Admitting: Family Medicine

## 2020-08-11 ENCOUNTER — Telehealth: Payer: Self-pay

## 2020-08-11 ENCOUNTER — Encounter: Payer: Self-pay | Admitting: Family Medicine

## 2020-08-11 DIAGNOSIS — D649 Anemia, unspecified: Secondary | ICD-10-CM

## 2020-08-11 NOTE — Telephone Encounter (Signed)
Sent My-Chart message

## 2020-08-12 NOTE — Addendum Note (Signed)
Addended by: Chalmers Cater on: 08/12/2020 08:20 AM   Modules accepted: Orders

## 2020-08-12 NOTE — Telephone Encounter (Signed)
Ok to wait until Jan and retest.

## 2020-09-30 LAB — CBC WITH DIFFERENTIAL/PLATELET
Absolute Monocytes: 478 cells/uL (ref 200–950)
Basophils Absolute: 32 cells/uL (ref 0–200)
Basophils Relative: 0.7 %
Eosinophils Absolute: 258 cells/uL (ref 15–500)
Eosinophils Relative: 5.6 %
HCT: 40.7 % (ref 38.5–50.0)
Hemoglobin: 14 g/dL (ref 13.2–17.1)
Lymphs Abs: 1458 cells/uL (ref 850–3900)
MCH: 32.1 pg (ref 27.0–33.0)
MCHC: 34.4 g/dL (ref 32.0–36.0)
MCV: 93.3 fL (ref 80.0–100.0)
MPV: 11.9 fL (ref 7.5–12.5)
Monocytes Relative: 10.4 %
Neutro Abs: 2374 cells/uL (ref 1500–7800)
Neutrophils Relative %: 51.6 %
Platelets: 143 10*3/uL (ref 140–400)
RBC: 4.36 10*6/uL (ref 4.20–5.80)
RDW: 11.8 % (ref 11.0–15.0)
Total Lymphocyte: 31.7 %
WBC: 4.6 10*3/uL (ref 3.8–10.8)

## 2020-10-03 LAB — IRON: Iron: 179 ug/dL (ref 50–180)

## 2020-10-03 LAB — FERRITIN: Ferritin: 27 ng/mL (ref 24–380)

## 2020-12-28 ENCOUNTER — Other Ambulatory Visit: Payer: Self-pay | Admitting: Family Medicine

## 2020-12-28 DIAGNOSIS — I10 Essential (primary) hypertension: Secondary | ICD-10-CM

## 2020-12-30 ENCOUNTER — Encounter: Payer: Self-pay | Admitting: Family Medicine

## 2020-12-30 ENCOUNTER — Other Ambulatory Visit: Payer: Self-pay

## 2020-12-30 ENCOUNTER — Ambulatory Visit (INDEPENDENT_AMBULATORY_CARE_PROVIDER_SITE_OTHER): Payer: BC Managed Care – PPO | Admitting: Family Medicine

## 2020-12-30 VITALS — BP 123/78 | HR 62 | Ht 70.0 in | Wt 191.0 lb

## 2020-12-30 DIAGNOSIS — I10 Essential (primary) hypertension: Secondary | ICD-10-CM

## 2020-12-30 DIAGNOSIS — E785 Hyperlipidemia, unspecified: Secondary | ICD-10-CM | POA: Diagnosis not present

## 2020-12-30 DIAGNOSIS — E611 Iron deficiency: Secondary | ICD-10-CM

## 2020-12-30 DIAGNOSIS — M75101 Unspecified rotator cuff tear or rupture of right shoulder, not specified as traumatic: Secondary | ICD-10-CM | POA: Insufficient documentation

## 2020-12-30 DIAGNOSIS — M25511 Pain in right shoulder: Secondary | ICD-10-CM

## 2020-12-30 DIAGNOSIS — I2510 Atherosclerotic heart disease of native coronary artery without angina pectoris: Secondary | ICD-10-CM

## 2020-12-30 MED ORDER — LOSARTAN POTASSIUM 25 MG PO TABS: 25.0000 mg | ORAL_TABLET | Freq: Every day | ORAL | 1 refills | Status: DC

## 2020-12-30 NOTE — Progress Notes (Signed)
Pt stated that about 2 weeks ago his R shoulder began to hurt.   He said that when he does a pulling motion it makes it worse. He denies any injury or trauma to his shoulder. He stated that

## 2020-12-30 NOTE — Assessment & Plan Note (Signed)
He would like to go ahead and recheck his lipids.  LDL goal is less than 70.  Last LDL was 2 9.

## 2020-12-30 NOTE — Progress Notes (Signed)
Established Patient Office Visit  Subjective:  Patient ID: Darrell Bridges, male    DOB: 01-Aug-1956  Age: 65 y.o. MRN: [id number]  CC:  Chief Complaint  Patient presents with  . Hypertension    HPI Darrell Bridges presents for   Hypertension- Pt denies chest pain, SOB, dizziness, or heart palpitations.  Taking meds as directed w/o problems.  Denies medication side effects.  Is only been on half a tab of the losartan for a total of 25 mg and has been following his blood pressures at home he said most of them have looked fantastic but occasionally still getting low numbers less than 110 but not very often.  He notices when he is more active his blood pressures are actually lower.  Right shoulder pain x2 weeks, no injury ro trauma. Pain over outer shoulder, throbbing at times.  Painful when pulls things. Normal ROM.  He was able to do yard work without it stopping him but says just been bothering him.  Hyperlipidemia - tolerating stating well with no myalgias or significant side effects.  Lab Results  Component Value Date   CHOL 121 07/08/2020   HDL 47 07/08/2020   LDLCALC 59 07/08/2020   LDLDIRECT 81 11/20/2012   TRIG 71 07/08/2020   CHOLHDL 2.6 07/08/2020   He was also recently found to be iron deficient this fall and was taking a supplement we did recheck his labs in January and they come up significantly.  He just completed his course of iron about 2 weeks ago   Past Medical History:  Diagnosis Date  . CAD (coronary artery disease)   . Hyperlipidemia   . Hypertension   . Nephrolithiasis     No past surgical history on file.  Family History  Problem Relation Age of Onset  . Heart attack Father        Died at 48 of MI  . Diabetes Mother   . Hyperlipidemia Other   . Hypertension Mother   . Stroke Other   . Thyroid disease Brother     Social History   Socioeconomic History  . Marital status: Married    Spouse name: Not on file  . Number of children: 2  . Years of  education: Not on file  . Highest education level: Not on file  Occupational History    Employer: [employer]  Tobacco Use  . 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 Smokeless tobacco: Never Used  Substance and Sexual Activity  . Alcohol use: Yes    Comment: Rarely  . Drug use: No  . Sexual activity: Yes  Other Topics Concern  . Not on file  Social History Narrative  . Not on file   Social Determinants of Health   Financial Resource Strain: Not on file  Food Insecurity: Not on file  Transportation Needs: Not on file  Physical Activity: Not on file  Stress: Not on file  Social Connections: Not on file  Intimate Partner Violence: Not on file    Outpatient Medications Prior to Visit  Medication Sig Dispense Refill  . aspirin EC 81 MG tablet Take 81 mg by mouth daily.    Marland Kitchen atorvastatin (LIPITOR) 40 MG tablet TAKE 1 TABLET BY MOUTH EVERYDAY AT BEDTIME 90 tablet 3  . Krill Oil 500 MG CAPS Take by mouth.    . magnesium gluconate (MAGONATE) 500 MG tablet Take 500 mg by mouth daily.    . metoprolol succinate (TOPROL-XL) 25 MG 24 hr tablet TAKE 1 TABLET  BY MOUTH EVERY DAY 90 tablet 1  . Multiple Vitamin (ONE-A-DAY MENS PO) Take 1 capsule by mouth daily.    . nitroGLYCERIN (NITROSTAT) 0.4 MG SL tablet Place 1 tablet (0.4 mg total) under the tongue every 5 (five) minutes as needed for chest pain. 20 tablet 1  . losartan (COZAAR) 50 MG tablet Take 0.5 tablets (25 mg total) by mouth in the morning and at bedtime. 180 tablet 1   No facility-administered medications prior to visit.    Allergies  Allergen Reactions  . Lisinopril     Other reaction(s): Cough    ROS Review of Systems    Objective:    Physical Exam Constitutional:      Appearance: He is well-developed.  HENT:     Head: Normocephalic and atraumatic.  Cardiovascular:     Rate and Rhythm: Normal rate and regular rhythm.     Heart sounds: Normal heart sounds.  Pulmonary:     Effort: Pulmonary effort is normal.      Breath sounds: Normal breath sounds.  Musculoskeletal:     Comments: Shoulder with normal range of motion.  Negative empty can test.  Some discomfort with internal rotation reaching behind his back but he was able to do it.  Tender just distal to the [facility] joint  Skin:    General: Skin is warm and dry.  Neurological:     Mental Status: He is alert and oriented to person, place, and time.  Psychiatric:        Behavior: Behavior normal.     BP 123/78   Pulse 62   Ht 5\' 10"  (1.778 m)   Wt 191 lb (86.6 kg)   SpO2 99%   BMI 27.41 kg/m  Wt Readings from Last 3 Encounters:  12/30/20 191 lb (86.6 kg)  06/29/20 189 lb 1.3 oz (85.8 kg)  12/23/19 198 lb (89.8 kg)     There are no preventive care reminders to display for this patient.  There are no preventive care reminders to display for this patient.  Lab Results  Component Value Date   TSH 2.230 06/28/2017   Lab Results  Component Value Date   WBC 4.6 09/30/2020   HGB 14.0 09/30/2020   HCT 40.7 09/30/2020   MCV 93.3 09/30/2020   PLT 143 09/30/2020   Lab Results  Component Value Date   NA 138 07/08/2020   K 4.6 07/08/2020   CO2 30 07/08/2020   GLUCOSE 104 (H) 07/08/2020   BUN 16 07/08/2020   CREATININE 1.19 07/08/2020   BILITOT 0.6 07/08/2020   ALKPHOS 60 01/01/2018   AST 22 07/08/2020   ALT 21 07/08/2020   PROT 6.5 07/08/2020   ALBUMIN 4.5 01/01/2018   CALCIUM 9.5 07/08/2020   Lab Results  Component Value Date   CHOL 121 07/08/2020   Lab Results  Component Value Date   HDL 47 07/08/2020   Lab Results  Component Value Date   LDLCALC 59 07/08/2020   Lab Results  Component Value Date   TRIG 71 07/08/2020   Lab Results  Component Value Date   CHOLHDL 2.6 07/08/2020   Lab Results  Component Value Date   HGBA1C 5.1 01/01/2018      Assessment & Plan:   Problem List Items Addressed This Visit      Cardiovascular and Mediastinum   Hypertension - Primary    Well controlled.  Change losartan to 25  mg so he does not have to split the tabs.  Continue  current regimen. Follow up in 6 months.        Relevant Medications   losartan (COZAAR) 25 MG tablet   Other Relevant Orders   COMPLETE METABOLIC PANEL WITH GFR   Lipid panel   CBC   Fe+TIBC+Fer   Coronary artery disease   Relevant Medications   losartan (COZAAR) 25 MG tablet     Other   Hyperlipidemia LDL goal <70    He would like to go ahead and recheck his lipids.  LDL goal is less than 70.  Last LDL was 2 9.      Relevant Medications   losartan (COZAAR) 25 MG tablet   Other Relevant Orders   COMPLETE METABOLIC PANEL WITH GFR   Lipid panel   CBC   Fe+TIBC+Fer   Acute pain of right shoulder    Other Visit Diagnoses    Iron deficiency       Relevant Orders   CBC   Fe+TIBC+Fer   Essential hypertension       Relevant Medications   losartan (COZAAR) 25 MG tablet     Right shoulder pain-suspect bursitis.  Given handout to do home PT.  Recommend icing as well okay to use anti-inflammatory as needed.  If he is not improving over the next 3 weeks then encouraged him to let me know and we will get him in with our sports medicine provider.  Iron deficiency-he completed his course about 2 weeks ago so just make sure that his levels are adequate.  Colonoscopy is up-to-date.  He really feels like this was triggered after having had COVID.  Meds ordered this encounter  Medications  . losartan (COZAAR) 25 MG tablet    Sig: Take 1 tablet (25 mg total) by mouth daily.    Dispense:  90 tablet    Refill:  1    Follow-up: Return in about 6 months (around 07/01/2021).    Nani Gasser, MD

## 2020-12-30 NOTE — Assessment & Plan Note (Addendum)
Well controlled.  Change losartan to 25 mg so he does not have to split the tabs.  Continue current regimen. Follow up in 6 months.

## 2021-01-07 LAB — COMPLETE METABOLIC PANEL WITH GFR
AG Ratio: 1.8 (calc) (ref 1.0–2.5)
ALT: 16 U/L (ref 9–46)
AST: 20 U/L (ref 10–35)
Albumin: 4.4 g/dL (ref 3.6–5.1)
Alkaline phosphatase (APISO): 56 U/L (ref 35–144)
BUN: 18 mg/dL (ref 7–25)
CO2: 27 mmol/L (ref 20–32)
Calcium: 9.5 mg/dL (ref 8.6–10.3)
Chloride: 104 mmol/L (ref 98–110)
Creat: 0.99 mg/dL (ref 0.70–1.25)
GFR, Est African American: 94 mL/min/{1.73_m2} (ref >=60)
GFR, Est Non African American: 81 mL/min/{1.73_m2} (ref >=60)
Globulin: 2.4 g/dL (calc) (ref 1.9–3.7)
Glucose, Bld: 87 mg/dL (ref 65–99)
Potassium: 4.1 mmol/L (ref 3.5–5.3)
Sodium: 140 mmol/L (ref 135–146)
Total Bilirubin: 0.5 mg/dL (ref 0.2–1.2)
Total Protein: 6.8 g/dL (ref 6.1–8.1)

## 2021-01-07 LAB — IRON,TIBC AND FERRITIN PANEL
%SAT: 25 % (calc) (ref 20–48)
Ferritin: 27 ng/mL (ref 24–380)
Iron: 97 ug/dL (ref 50–180)
TIBC: 389 mcg/dL (calc) (ref 250–425)

## 2021-01-07 LAB — CBC
HCT: 40.4 % (ref 38.5–50.0)
Hemoglobin: 13.4 g/dL (ref 13.2–17.1)
MCH: 31.9 pg (ref 27.0–33.0)
MCHC: 33.2 g/dL (ref 32.0–36.0)
MCV: 96.2 fL (ref 80.0–100.0)
MPV: 11.5 fL (ref 7.5–12.5)
Platelets: 149 10*3/uL (ref 140–400)
RBC: 4.2 10*6/uL (ref 4.20–5.80)
RDW: 11.8 % (ref 11.0–15.0)
WBC: 4.4 10*3/uL (ref 3.8–10.8)

## 2021-01-07 LAB — LIPID PANEL
Cholesterol: 133 mg/dL (ref <200)
HDL: 50 mg/dL (ref >=40)
LDL Cholesterol (Calc): 68 mg/dL (calc)
Non-HDL Cholesterol (Calc): 83 mg/dL (calc) (ref <130)
Total CHOL/HDL Ratio: 2.7 (calc) (ref <5.0)
Triglycerides: 70 mg/dL (ref <150)

## 2021-03-29 ENCOUNTER — Telehealth: Payer: Self-pay | Admitting: General Practice

## 2021-03-29 NOTE — Telephone Encounter (Signed)
Transition Care Management Unsuccessful Follow-up Telephone Call  Date of discharge and from where:  03/27/21 from [facility]   Attempts:  1st Attempt  Reason for unsuccessful TCM follow-up call:  Left voice message

## 2021-03-30 NOTE — Telephone Encounter (Signed)
Transition Care Management Unsuccessful Follow-up Telephone Call  Date of discharge and from where:  03/27/21 from [facility]  Attempts:  2nd Attempt  Reason for unsuccessful TCM follow-up call:  Left voice message

## 2021-03-31 NOTE — Telephone Encounter (Signed)
Transition Care Management Unsuccessful Follow-up Telephone Call  Date of discharge and from where:  03/27/21 from [facility]  Attempts:  3rd Attempt  Reason for unsuccessful TCM follow-up call:  Left voice message

## 2021-06-21 ENCOUNTER — Other Ambulatory Visit: Payer: Self-pay | Admitting: Family Medicine

## 2021-06-21 DIAGNOSIS — I2510 Atherosclerotic heart disease of native coronary artery without angina pectoris: Secondary | ICD-10-CM

## 2021-06-21 DIAGNOSIS — I10 Essential (primary) hypertension: Secondary | ICD-10-CM

## 2021-06-22 ENCOUNTER — Other Ambulatory Visit: Payer: Self-pay | Admitting: Family Medicine

## 2021-06-22 DIAGNOSIS — I2510 Atherosclerotic heart disease of native coronary artery without angina pectoris: Secondary | ICD-10-CM

## 2021-06-22 DIAGNOSIS — E785 Hyperlipidemia, unspecified: Secondary | ICD-10-CM

## 2021-07-05 ENCOUNTER — Ambulatory Visit (INDEPENDENT_AMBULATORY_CARE_PROVIDER_SITE_OTHER): Payer: BC Managed Care – PPO | Admitting: Family Medicine

## 2021-07-05 ENCOUNTER — Encounter: Payer: Self-pay | Admitting: Family Medicine

## 2021-07-05 VITALS — BP 131/77 | HR 63 | Ht 70.0 in | Wt 186.0 lb

## 2021-07-05 DIAGNOSIS — E611 Iron deficiency: Secondary | ICD-10-CM | POA: Insufficient documentation

## 2021-07-05 DIAGNOSIS — E7800 Pure hypercholesterolemia, unspecified: Secondary | ICD-10-CM

## 2021-07-05 DIAGNOSIS — I2510 Atherosclerotic heart disease of native coronary artery without angina pectoris: Secondary | ICD-10-CM

## 2021-07-05 DIAGNOSIS — E785 Hyperlipidemia, unspecified: Secondary | ICD-10-CM | POA: Diagnosis not present

## 2021-07-05 DIAGNOSIS — M25511 Pain in right shoulder: Secondary | ICD-10-CM

## 2021-07-05 DIAGNOSIS — I10 Essential (primary) hypertension: Secondary | ICD-10-CM

## 2021-07-05 DIAGNOSIS — Z23 Encounter for immunization: Secondary | ICD-10-CM | POA: Diagnosis not present

## 2021-07-05 DIAGNOSIS — Z125 Encounter for screening for malignant neoplasm of prostate: Secondary | ICD-10-CM

## 2021-07-05 NOTE — Assessment & Plan Note (Signed)
Plan to recheck ferritin level.

## 2021-07-05 NOTE — Progress Notes (Signed)
Established Patient Office Visit  Subjective:  Patient ID: Darrell Bridges, male    DOB: 1956-08-31  Age: 65 y.o. MRN: [id number]  CC:  Chief Complaint  Patient presents with   Hypertension    HPI Darrell Bridges presents for   Hypertension- Pt denies chest pain, SOB, dizziness, or heart palpitations.  Taking meds as directed w/o problems.  Denies medication side effects.    F/U CAD -he previously saw Darrell Bridges yearly.  But he recently retired so he has a new patient appoint with Darrell Bridges in a couple of weeks it will be his first appointment with him.  He has not had any recent problems that he did not going to the emergency department in July for just not feeling quite right and having some chest discomfort.  Everything checked out and he has not had any problems since.  Hyperlipidemia - tolerating stating well with no myalgias or significant side effects.  Lab Results  Component Value Date   CHOL 133 01/06/2021   HDL 50 01/06/2021   LDLCALC 68 01/06/2021   LDLDIRECT 81 11/20/2012   TRIG 70 01/06/2021   CHOLHDL 2.7 01/06/2021    He is also still having a lot of pain and problems with his right shoulder.  He has been doing the exercises at home but really is not getting better it is painful to sleep on that side and sometimes it some is a sharp grabbing sensation more so over the anterior shoulder.  Past Medical History:  Diagnosis Date   CAD (coronary artery disease)    Hyperlipidemia    Hypertension    Nephrolithiasis     No past surgical history on file.  Family History  Problem Relation Age of Onset   Heart attack Father        Died at 90 of MI   Diabetes Mother    Hyperlipidemia Other    Hypertension Mother    Stroke Other    Thyroid disease Brother     Social History   Socioeconomic History   Marital status: Married    Spouse name: Not on file   Number of children: 2   Years of education: Not on file   Highest education level: Not on file   Occupational History    Employer: [employer]  Tobacco Use   Smoking status: Former   Smokeless tobacco: Never  Substance and Sexual Activity   Alcohol use: Yes    Comment: Rarely   Drug use: No   Sexual activity: Yes  Other Topics Concern   Not on file  Social History Narrative   Not on file   Social Determinants of Health   Financial Resource Strain: Not on file  Food Insecurity: Not on file  Transportation Needs: Not on file  Physical Activity: Not on file  Stress: Not on file  Social Connections: Not on file  Intimate Partner Violence: Not on file    Outpatient Medications Prior to Visit  Medication Sig Dispense Refill   aspirin EC 81 MG tablet Take 81 mg by mouth daily.     atorvastatin (LIPITOR) 40 MG tablet TAKE 1 TABLET BY MOUTH EVERYDAY AT BEDTIME 90 tablet 3   Krill Oil 500 MG CAPS Take by mouth.     losartan (COZAAR) 25 MG tablet Take 1 tablet (25 mg total) by mouth daily. 90 tablet 1   magnesium gluconate (MAGONATE) 500 MG tablet Take 500 mg by mouth daily.     metoprolol succinate (TOPROL-XL)  25 MG 24 hr tablet TAKE 1 TABLET BY MOUTH EVERY DAY 90 tablet 1   Multiple Vitamin (ONE-A-DAY MENS PO) Take 1 capsule by mouth daily.     nitroGLYCERIN (NITROSTAT) 0.4 MG SL tablet Place 1 tablet (0.4 mg total) under the tongue every 5 (five) minutes as needed for chest pain. 20 tablet 1   No facility-administered medications prior to visit.    Allergies  Allergen Reactions   Lisinopril     Other reaction(s): Cough    ROS Review of Systems    Objective:    Physical Exam Constitutional:      Appearance: Normal appearance. He is well-developed.  HENT:     Head: Normocephalic and atraumatic.  Cardiovascular:     Rate and Rhythm: Normal rate and regular rhythm.     Heart sounds: Normal heart sounds.  Pulmonary:     Effort: Pulmonary effort is normal.     Breath sounds: Normal breath sounds.  Skin:    General: Skin is warm and dry.  Neurological:      Mental Status: He is alert and oriented to person, place, and time. Mental status is at baseline.  Psychiatric:        Behavior: Behavior normal.    BP 131/77   Pulse 63   Ht 5\' 10"  (1.778 m)   Wt 186 lb (84.4 kg)   SpO2 100%   BMI 26.69 kg/m  Wt Readings from Last 3 Encounters:  07/05/21 186 lb (84.4 kg)  12/30/20 191 lb (86.6 kg)  06/29/20 189 lb 1.3 oz (85.8 kg)     Health Maintenance Due  Topic Date Due   Zoster Vaccines- Shingrix (1 of 2) Never done    There are no preventive care reminders to display for this patient.  Lab Results  Component Value Date   TSH 2.230 06/28/2017   Lab Results  Component Value Date   WBC 4.4 01/06/2021   HGB 13.4 01/06/2021   HCT 40.4 01/06/2021   MCV 96.2 01/06/2021   PLT 149 01/06/2021   Lab Results  Component Value Date   NA 140 01/06/2021   K 4.1 01/06/2021   CO2 27 01/06/2021   GLUCOSE 87 01/06/2021   BUN 18 01/06/2021   CREATININE 0.99 01/06/2021   BILITOT 0.5 01/06/2021   ALKPHOS 60 01/01/2018   AST 20 01/06/2021   ALT 16 01/06/2021   PROT 6.8 01/06/2021   ALBUMIN 4.5 01/01/2018   CALCIUM 9.5 01/06/2021   Lab Results  Component Value Date   CHOL 133 01/06/2021   Lab Results  Component Value Date   HDL 50 01/06/2021   Lab Results  Component Value Date   LDLCALC 68 01/06/2021   Lab Results  Component Value Date   TRIG 70 01/06/2021   Lab Results  Component Value Date   CHOLHDL 2.7 01/06/2021   Lab Results  Component Value Date   HGBA1C 5.1 01/01/2018      Assessment & Plan:   Problem List Items Addressed This Visit       Cardiovascular and Mediastinum   Hypertension    Well controlled. Continue current regimen. Follow up in  6 mo . Due for BMP      Relevant Orders   BASIC METABOLIC PANEL WITH GFR   Coronary artery disease    Will be establishing with Dr. 03/03/2018 in the next couple of weeks.  MI was in 2011.  He has been continued on a statin, aspirin and beta-blocker.  Relevant  Orders   BASIC METABOLIC PANEL WITH GFR     Other   Pure hypercholesterolemia    Last LDL less than 70 and lipids are up-to-date.  I gave him a copy to give to his new cardiologist.      Iron deficiency    Plan to recheck ferritin level.      Relevant Orders   Ferritin   Hyperlipidemia LDL goal <70   Acute pain of right shoulder   Other Visit Diagnoses     Need for immunization against influenza    -  Primary   Relevant Orders   Flu Vaccine QUAD 85mo+IM (Fluarix, Fluzone & Alfiuria Quad PF) (Completed)   Screening for prostate cancer       Relevant Orders   PSA   Need for Tdap vaccination       Relevant Orders   Tdap vaccine greater than or equal to 7yo IM (Completed)       Given Tdap today.    Right Shoulder pain-encouraged him to schedule appoint with our sports med doc for further evaluation and further definitive treatment.  Given copy of lipids to take with his to new cardiology appointment.  No orders of the defined types were placed in this encounter.   Follow-up: Return in about 6 months (around 01/03/2022) for Hypertension.    Nani Gasser, MD

## 2021-07-05 NOTE — Assessment & Plan Note (Signed)
Last LDL less than 70 and lipids are up-to-date.  I gave him a copy to give to his new cardiologist.

## 2021-07-05 NOTE — Assessment & Plan Note (Addendum)
Will be establishing with Dr. Ivan Croft in the next couple of weeks.  MI was in 2011.  He has been continued on a statin, aspirin and beta-blocker.

## 2021-07-05 NOTE — Assessment & Plan Note (Signed)
Well controlled. Continue current regimen. Follow up in  6 mo . Due for BMP 

## 2021-07-06 LAB — BASIC METABOLIC PANEL WITH GFR
BUN: 23 mg/dL (ref 7–25)
CO2: 30 mmol/L (ref 20–32)
Calcium: 9.7 mg/dL (ref 8.6–10.3)
Chloride: 103 mmol/L (ref 98–110)
Creat: 1.08 mg/dL (ref 0.70–1.35)
Glucose, Bld: 89 mg/dL (ref 65–99)
Potassium: 4.2 mmol/L (ref 3.5–5.3)
Sodium: 141 mmol/L (ref 135–146)
eGFR: 77 mL/min/{1.73_m2} (ref >=60)

## 2021-07-06 LAB — PSA: PSA: 0.35 ng/mL (ref <=4.00)

## 2021-07-06 LAB — FERRITIN: Ferritin: 46 ng/mL (ref 24–380)

## 2021-07-06 NOTE — Progress Notes (Signed)
Hi Jamael, metabolic panel looks great.  Prostate test is normal.  Iron stores look great we have been trying to work to get to greater than 40 and it is now 27 which is fantastic.  I would recommend continuing your extra iron for 3 months and then you can stop at that point and just work on eating iron rich food.

## 2021-07-12 ENCOUNTER — Ambulatory Visit (INDEPENDENT_AMBULATORY_CARE_PROVIDER_SITE_OTHER): Payer: BC Managed Care – PPO

## 2021-07-12 ENCOUNTER — Other Ambulatory Visit: Payer: Self-pay

## 2021-07-12 ENCOUNTER — Ambulatory Visit (INDEPENDENT_AMBULATORY_CARE_PROVIDER_SITE_OTHER): Payer: BC Managed Care – PPO | Admitting: Sports Medicine

## 2021-07-12 DIAGNOSIS — M25511 Pain in right shoulder: Secondary | ICD-10-CM | POA: Diagnosis not present

## 2021-07-12 DIAGNOSIS — M75101 Unspecified rotator cuff tear or rupture of right shoulder, not specified as traumatic: Secondary | ICD-10-CM

## 2021-07-12 NOTE — Progress Notes (Signed)
    Procedures performed today:    None.  Independent interpretation of notes and tests performed by another provider:   None.  Brief History, Exam, Impression, and Recommendations:    Rotator cuff tear, right [name] is a pleasant 65 year old male, he has had several months of pain in his right shoulder localized over the deltoid, worse with reaching and overhead activities, does have nighttime pain. On exam he has positive Neer's, Hawkins, empty can signs, positive speeds test, he does have significant weakness to the empty can signs suggestive of a torn and retracted supraspinatus. Due to his significant weakness and duration of symptoms we are to proceed with x-rays and MRI. If full-thickness retracted tear we will refer to Dr. Everardo Pacific, if minor tearing and/or bursitis we will do injections and physical therapy.    ___________________________________________ Darrell Bridges. Benjamin Stain, M.D., ABFM., CAQSM. Primary Care and Sports Medicine [facility]  Adjunct Instructor of Family Medicine  [employer]

## 2021-07-12 NOTE — Assessment & Plan Note (Signed)
Cruz is a pleasant 65 year old male, he has had several months of pain in his right shoulder localized over the deltoid, worse with reaching and overhead activities, does have nighttime pain. On exam he has positive Neer's, Hawkins, empty can signs, positive speeds test, he does have significant weakness to the empty can signs suggestive of a torn and retracted supraspinatus. Due to his significant weakness and duration of symptoms we are to proceed with x-rays and MRI. If full-thickness retracted tear we will refer to Dr. Everardo Pacific, if minor tearing and/or bursitis we will do injections and physical therapy.

## 2021-10-22 IMAGING — DX DG SHOULDER 2+V*R*
3 series · 3 of 3 positions shown · non-contrast
Comparison: None.

CLINICAL DATA: Right shoulder pain

EXAM:
RIGHT SHOULDER - 2+ VIEW

[shoulder grashey]
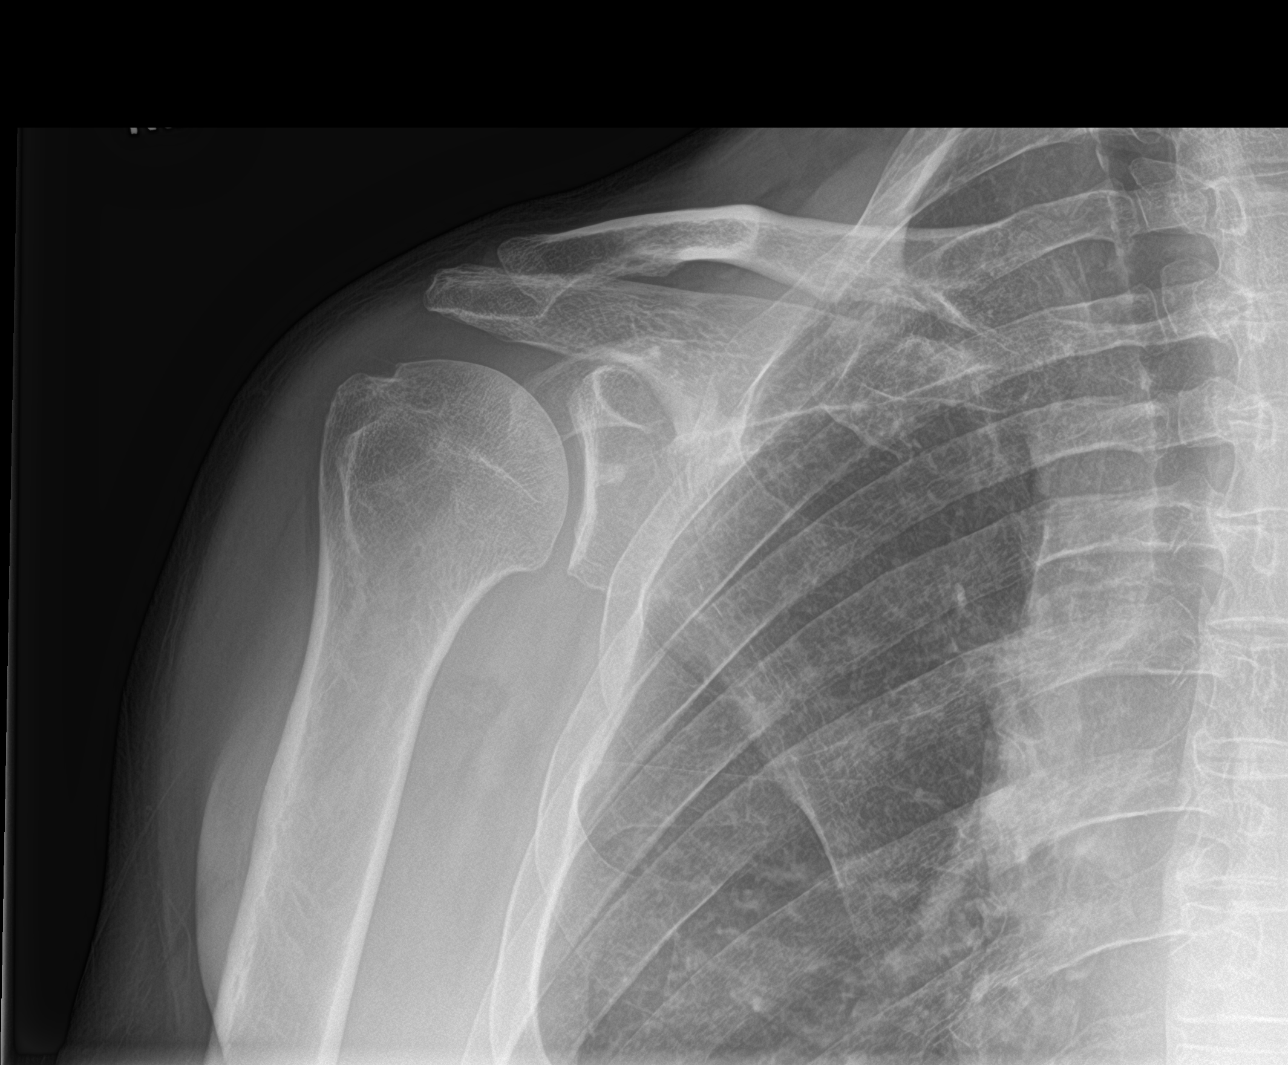

[shoulder y view]
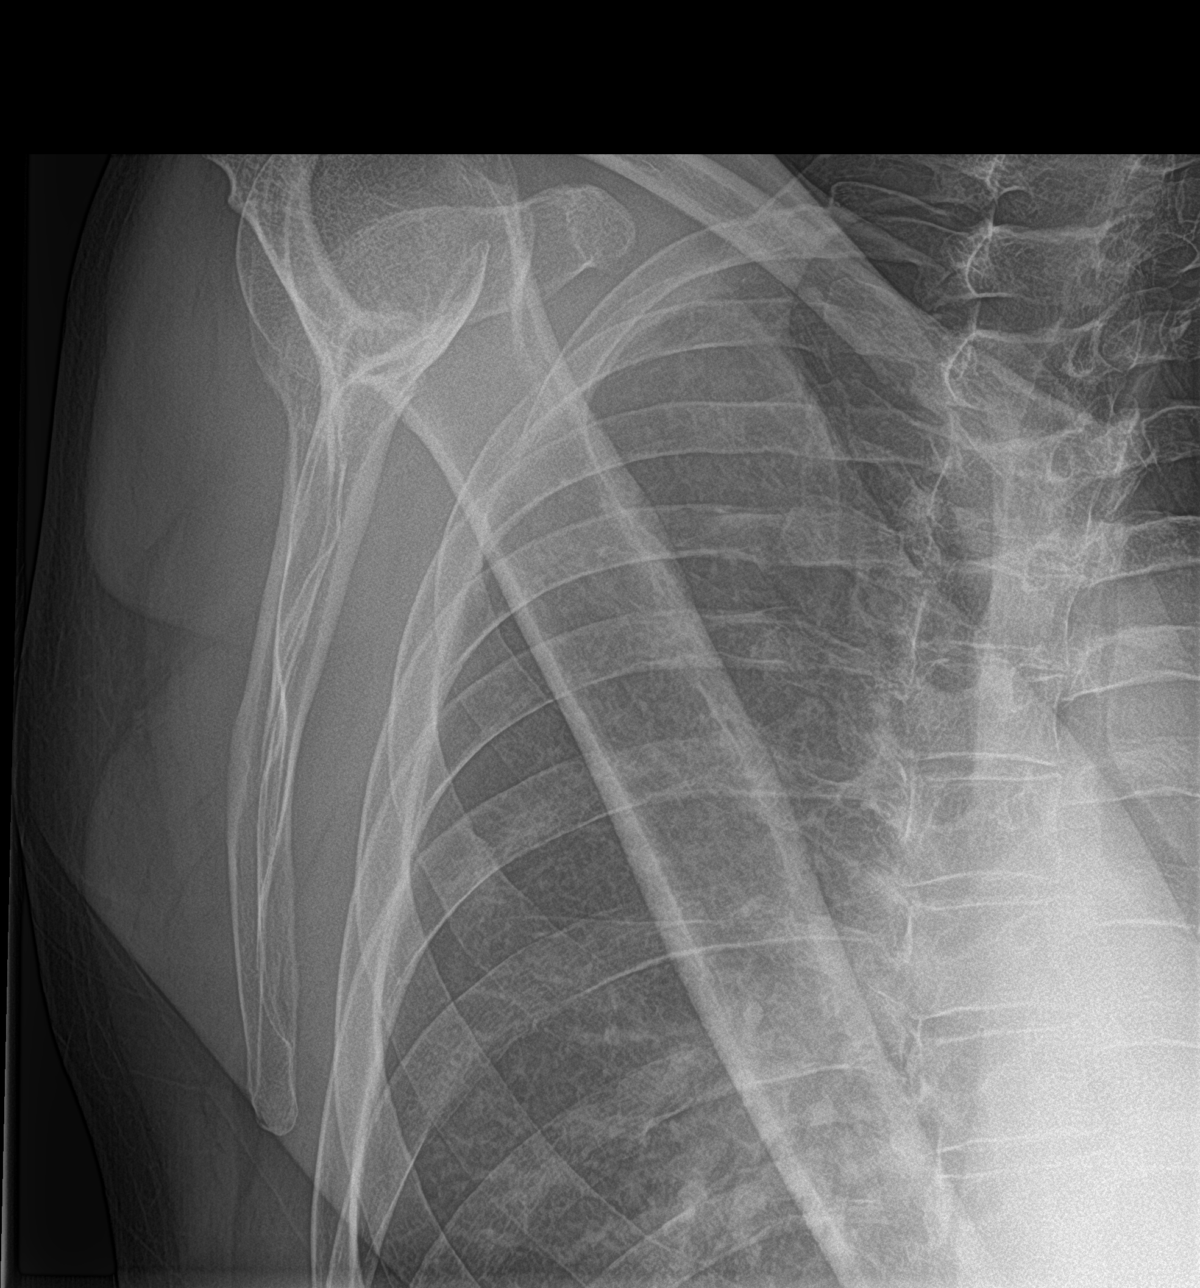

[shoulder axillary]
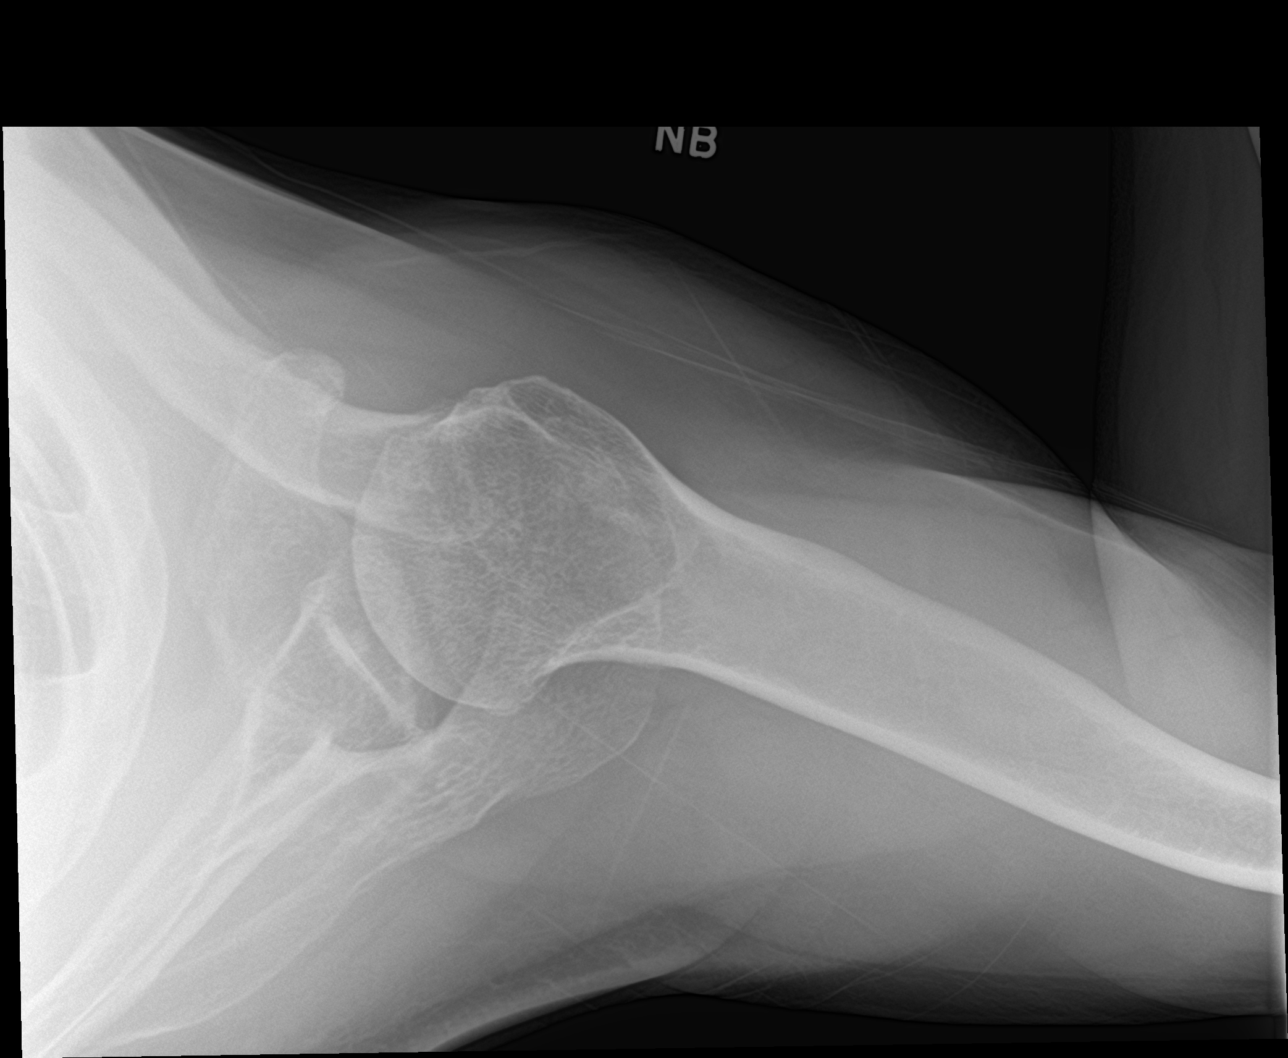

[3 of 3 positions shown; findings below may reference images not displayed]

FINDINGS: There is no evidence of fracture or dislocation. There is no
evidence of arthropathy or other focal bone abnormality. Soft
tissues are unremarkable.
IMPRESSION: Negative.

## 2021-12-25 ENCOUNTER — Other Ambulatory Visit: Payer: Self-pay | Admitting: Family Medicine

## 2021-12-25 DIAGNOSIS — I10 Essential (primary) hypertension: Secondary | ICD-10-CM

## 2022-01-03 ENCOUNTER — Ambulatory Visit: Payer: BC Managed Care – PPO | Admitting: Family Medicine

## 2022-01-03 ENCOUNTER — Encounter: Payer: Self-pay | Admitting: Family Medicine

## 2022-01-03 VITALS — BP 110/71 | HR 59 | Resp 16 | Ht 70.0 in | Wt 188.0 lb

## 2022-01-03 DIAGNOSIS — E785 Hyperlipidemia, unspecified: Secondary | ICD-10-CM

## 2022-01-03 DIAGNOSIS — E611 Iron deficiency: Secondary | ICD-10-CM

## 2022-01-03 DIAGNOSIS — I10 Essential (primary) hypertension: Secondary | ICD-10-CM | POA: Diagnosis not present

## 2022-01-03 DIAGNOSIS — I2510 Atherosclerotic heart disease of native coronary artery without angina pectoris: Secondary | ICD-10-CM | POA: Diagnosis not present

## 2022-01-03 MED ORDER — ATORVASTATIN CALCIUM 40 MG PO TABS: 40.0000 mg | ORAL_TABLET | Freq: Every day | ORAL | 3 refills | Status: DC

## 2022-01-03 MED ORDER — LOSARTAN POTASSIUM 25 MG PO TABS: 25.0000 mg | ORAL_TABLET | Freq: Every day | ORAL | 3 refills | Status: DC

## 2022-01-03 NOTE — Assessment & Plan Note (Signed)
Due to recheck lipids and liver enzymes on his current statin.  He has been tolerating it well. ?

## 2022-01-03 NOTE — Assessment & Plan Note (Addendum)
Well controlled. Continue current regimen. Follow up in  6 months. Colonoscopy is up to date.  ?

## 2022-01-03 NOTE — Assessment & Plan Note (Addendum)
Follow-up with cardiology this summer.  Continue statin, metoprolol, aspirin. ?

## 2022-01-03 NOTE — Progress Notes (Signed)
? ?Established Patient Office Visit ? ?Subjective:  ?Patient ID: Darrell Bridges, male    DOB: 1956/03/11  Age: 66 y.o. MRN: [id number] ? ?CC:  ?Chief Complaint  ?Patient presents with  ? Hypertension  ?  Follow up   ? ? ?HPI ?Darrell Bridges presents for  ? ?Hypertension- Pt denies chest pain, SOB, dizziness, or heart palpitations.  Taking meds as directed w/o problems.  Denies medication side effects.   ? ?Has been off iron for several months. Due to recheck levels.  He has not noticed any blood in the urine or stool. ? ?Coronary artery disease-no recent chest pain.  He has not had to use his nitroglycerin. ? ? ?Past Medical History:  ?Diagnosis Date  ? CAD (coronary artery disease)   ? Hyperlipidemia   ? Hypertension   ? Nephrolithiasis   ? ? ?No past surgical history on file. ? ?Family History  ?Problem Relation Age of Onset  ? Heart attack Father   ?     Died at 71 of MI  ? Diabetes Mother   ? Hyperlipidemia Other   ? Hypertension Mother   ? Stroke Other   ? Thyroid disease Brother   ? ? ?Social History  ? ?Socioeconomic History  ? Marital status: Married  ?  Spouse name: Not on file  ? Number of children: 2  ? Years of education: Not on file  ? Highest education level: Not on file  ?Occupational History  ?  Employer: [employer]  ?Tobacco Use  ? Smoking status: Former  ? Smokeless tobacco: Never  ?Substance and Sexual Activity  ? Alcohol use: Yes  ?  Comment: Rarely  ? Drug use: No  ? Sexual activity: Yes  ?Other Topics Concern  ? Not on file  ?Social History Narrative  ? Not on file  ? ?Social Determinants of Health  ? ?Financial Resource Strain: Not on file  ?Food Insecurity: Not on file  ?Transportation Needs: Not on file  ?Physical Activity: Not on file  ?Stress: Not on file  ?Social Connections: Not on file  ?Intimate Partner Violence: Not on file  ? ? ?Outpatient Medications Prior to Visit  ?Medication Sig Dispense Refill  ? aspirin EC 81 MG tablet Take 81 mg by mouth daily.    ? Krill Oil 500 MG CAPS  Take by mouth.    ? magnesium gluconate (MAGONATE) 500 MG tablet Take 500 mg by mouth daily.    ? metoprolol succinate (TOPROL-XL) 25 MG 24 hr tablet TAKE 1 TABLET BY MOUTH EVERY DAY 90 tablet 1  ? Multiple Vitamin (ONE-A-DAY MENS PO) Take 1 capsule by mouth daily.    ? nitroGLYCERIN (NITROSTAT) 0.4 MG SL tablet Place 1 tablet (0.4 mg total) under the tongue every 5 (five) minutes as needed for chest pain. 20 tablet 1  ? atorvastatin (LIPITOR) 40 MG tablet TAKE 1 TABLET BY MOUTH EVERYDAY AT BEDTIME 90 tablet 3  ? losartan (COZAAR) 25 MG tablet Take 1 tablet (25 mg total) by mouth daily. 90 tablet 1  ? ?No facility-administered medications prior to visit.  ? ? ?Allergies  ?Allergen Reactions  ? Lisinopril   ?  Other reaction(s): Cough  ? ? ?ROS ?Review of Systems ? ?  ?Objective:  ?  ?Physical Exam ?Constitutional:   ?   Appearance: Normal appearance. He is well-developed.  ?HENT:  ?   Head: Normocephalic and atraumatic.  ?Cardiovascular:  ?   Rate and Rhythm: Normal rate and regular rhythm.  ?  Heart sounds: Normal heart sounds.  ?Pulmonary:  ?   Effort: Pulmonary effort is normal.  ?   Breath sounds: Normal breath sounds.  ?Skin: ?   General: Skin is warm and dry.  ?Neurological:  ?   Mental Status: He is alert and oriented to person, place, and time. Mental status is at baseline.  ?Psychiatric:     ?   Behavior: Behavior normal.  ? ? ?BP 110/71   Pulse (!) 59   Resp 16   Ht 5' 10"  (1.778 m)   Wt 188 lb (85.3 kg)   SpO2 97%   BMI 26.98 kg/m?  ?Wt Readings from Last 3 Encounters:  ?01/03/22 188 lb (85.3 kg)  ?07/05/21 186 lb (84.4 kg)  ?12/30/20 191 lb (86.6 kg)  ? ? ? ?There are no preventive care reminders to display for this patient. ? ?There are no preventive care reminders to display for this patient. ? ?Lab Results  ?Component Value Date  ? TSH 2.230 06/28/2017  ? ?Lab Results  ?Component Value Date  ? WBC 4.4 01/06/2021  ? HGB 13.4 01/06/2021  ? HCT 40.4 01/06/2021  ? MCV 96.2 01/06/2021  ? PLT 149  01/06/2021  ? ?Lab Results  ?Component Value Date  ? NA 141 07/05/2021  ? K 4.2 07/05/2021  ? CO2 30 07/05/2021  ? GLUCOSE 89 07/05/2021  ? BUN 23 07/05/2021  ? CREATININE 1.08 07/05/2021  ? BILITOT 0.5 01/06/2021  ? ALKPHOS 60 01/01/2018  ? AST 20 01/06/2021  ? ALT 16 01/06/2021  ? PROT 6.8 01/06/2021  ? ALBUMIN 4.5 01/01/2018  ? CALCIUM 9.7 07/05/2021  ? EGFR 77 07/05/2021  ? ?Lab Results  ?Component Value Date  ? CHOL 133 01/06/2021  ? ?Lab Results  ?Component Value Date  ? HDL 50 01/06/2021  ? ?Lab Results  ?Component Value Date  ? Gove 68 01/06/2021  ? ?Lab Results  ?Component Value Date  ? TRIG 70 01/06/2021  ? ?Lab Results  ?Component Value Date  ? CHOLHDL 2.7 01/06/2021  ? ?Lab Results  ?Component Value Date  ? HGBA1C 5.1 01/01/2018  ? ? ?  ?Assessment & Plan:  ? ?Problem List Items Addressed This Visit   ? ?  ? Cardiovascular and Mediastinum  ? Hypertension  ?  Well controlled. Continue current regimen. Follow up in  6 months. Colonoscopy is up to date.  ?  ?  ? Relevant Medications  ? losartan (COZAAR) 25 MG tablet  ? atorvastatin (LIPITOR) 40 MG tablet  ? Coronary artery disease  ?  Follow-up with cardiology this summer.  Continue statin, metoprolol, aspirin. ?  ?  ? Relevant Medications  ? losartan (COZAAR) 25 MG tablet  ? atorvastatin (LIPITOR) 40 MG tablet  ? Other Relevant Orders  ? Fe+TIBC+Fer  ? COMPLETE METABOLIC PANEL WITH GFR  ? Lipid Panel w/reflex Direct LDL  ?  ? Other  ? Iron deficiency - Primary  ? Relevant Orders  ? Fe+TIBC+Fer  ? Hyperlipidemia LDL goal <70  ?  Due to recheck lipids and liver enzymes on his current statin.  He has been tolerating it well. ?  ?  ? Relevant Medications  ? losartan (COZAAR) 25 MG tablet  ? atorvastatin (LIPITOR) 40 MG tablet  ? Other Relevant Orders  ? Fe+TIBC+Fer  ? COMPLETE METABOLIC PANEL WITH GFR  ? Lipid Panel w/reflex Direct LDL  ? ?Other Visit Diagnoses   ? ? Essential hypertension      ? Relevant Medications  ?  losartan (COZAAR) 25 MG tablet  ?  atorvastatin (LIPITOR) 40 MG tablet  ? Other Relevant Orders  ? Fe+TIBC+Fer  ? COMPLETE METABOLIC PANEL WITH GFR  ? Lipid Panel w/reflex Direct LDL  ? ?  ? ? ?Meds ordered this encounter  ?Medications  ? losartan (COZAAR) 25 MG tablet  ?  Sig: Take 1 tablet (25 mg total) by mouth daily.  ?  Dispense:  90 tablet  ?  Refill:  3  ? atorvastatin (LIPITOR) 40 MG tablet  ?  Sig: Take 1 tablet (40 mg total) by mouth at bedtime.  ?  Dispense:  90 tablet  ?  Refill:  3  ? ? ?Follow-up: Return in about 6 months (around 07/05/2022) for Hypertension.  ? ? ?Beatrice Lecher, MD ?

## 2022-01-06 LAB — LIPID PANEL W/REFLEX DIRECT LDL
Cholesterol: 130 mg/dL (ref <200)
HDL: 54 mg/dL (ref >=40)
LDL Cholesterol (Calc): 62 mg/dL (calc)
Non-HDL Cholesterol (Calc): 76 mg/dL (calc) (ref <130)
Total CHOL/HDL Ratio: 2.4 (calc) (ref <5.0)
Triglycerides: 64 mg/dL (ref <150)

## 2022-01-06 LAB — COMPLETE METABOLIC PANEL WITH GFR
AG Ratio: 1.8 (calc) (ref 1.0–2.5)
ALT: 20 U/L (ref 9–46)
AST: 26 U/L (ref 10–35)
Albumin: 4.3 g/dL (ref 3.6–5.1)
Alkaline phosphatase (APISO): 51 U/L (ref 35–144)
BUN: 15 mg/dL (ref 7–25)
CO2: 28 mmol/L (ref 20–32)
Calcium: 9.5 mg/dL (ref 8.6–10.3)
Chloride: 104 mmol/L (ref 98–110)
Creat: 1.05 mg/dL (ref 0.70–1.35)
Globulin: 2.4 g/dL (calc) (ref 1.9–3.7)
Glucose, Bld: 102 mg/dL — ABNORMAL HIGH (ref 65–99)
Potassium: 4.4 mmol/L (ref 3.5–5.3)
Sodium: 141 mmol/L (ref 135–146)
Total Bilirubin: 0.7 mg/dL (ref 0.2–1.2)
Total Protein: 6.7 g/dL (ref 6.1–8.1)
eGFR: 80 mL/min/{1.73_m2} (ref >=60)

## 2022-01-06 LAB — IRON,TIBC AND FERRITIN PANEL
%SAT: 28 % (calc) (ref 20–48)
Ferritin: 39 ng/mL (ref 24–380)
Iron: 105 ug/dL (ref 50–180)
TIBC: 371 mcg/dL (calc) (ref 250–425)

## 2022-01-06 NOTE — Progress Notes (Signed)
Hi Aquarius, your metabolic panel and cholesterol look great.  Your iron levels look good.  Your ferritin is still a little bit on the lower end but overall looks good.

## 2022-06-26 ENCOUNTER — Other Ambulatory Visit: Payer: Self-pay | Admitting: Family Medicine

## 2022-06-26 DIAGNOSIS — I10 Essential (primary) hypertension: Secondary | ICD-10-CM

## 2022-07-04 ENCOUNTER — Ambulatory Visit: Payer: BC Managed Care – PPO | Admitting: Family Medicine

## 2022-08-08 ENCOUNTER — Ambulatory Visit (INDEPENDENT_AMBULATORY_CARE_PROVIDER_SITE_OTHER): Payer: BC Managed Care – PPO | Admitting: Family Medicine

## 2022-08-08 ENCOUNTER — Encounter: Payer: Self-pay | Admitting: Family Medicine

## 2022-08-08 VITALS — BP 134/81 | HR 51 | Ht 70.0 in | Wt 182.0 lb

## 2022-08-08 DIAGNOSIS — M75101 Unspecified rotator cuff tear or rupture of right shoulder, not specified as traumatic: Secondary | ICD-10-CM

## 2022-08-08 DIAGNOSIS — Z23 Encounter for immunization: Secondary | ICD-10-CM

## 2022-08-08 DIAGNOSIS — I10 Essential (primary) hypertension: Secondary | ICD-10-CM | POA: Diagnosis not present

## 2022-08-08 DIAGNOSIS — E7800 Pure hypercholesterolemia, unspecified: Secondary | ICD-10-CM

## 2022-08-08 DIAGNOSIS — E611 Iron deficiency: Secondary | ICD-10-CM

## 2022-08-08 NOTE — Assessment & Plan Note (Signed)
He is now on decreased dose of atorvastatin we will plan to recheck levels in the spring.  His LDL needs to be less than 70 otherwise we can consider going back up to 40 mg on the atorvastatin, or switch to Crestor 20 mg per cardiology notes.

## 2022-08-08 NOTE — Assessment & Plan Note (Signed)
Lan to recheck iron in the spring.

## 2022-08-08 NOTE — Assessment & Plan Note (Signed)
Blood pressure looks good today we will continue to work on healthy diet and regular exercise.  He is actually lost about 10 pounds and is more physically active at his new job which is great.

## 2022-08-08 NOTE — Assessment & Plan Note (Signed)
Still having some pain and popping and cracking but is not nearly as bad as it was.  He will let me know how he is doing next time I see him he can always call if he is having increased pain or concerns.

## 2022-08-08 NOTE — Progress Notes (Signed)
Established Patient Office Visit  Subjective   Patient ID: Darrell Bridges, male    DOB: 10/22/1955  Age: 66 y.o. MRN: [id number]  Chief Complaint  Patient presents with   Follow-up    HPI  Hypertension- Pt denies chest pain, SOB, dizziness, or heart palpitations.  Taking meds as directed w/o problems.  Denies medication side effects.    Saw cardiology, Dr. Ivan Croft at [facility] back in August follow-up on his coronary artery disease was previously taking atorvastatin 40 mg but they have discussed that he was noticing some memory issues and so reported that it would be okay for him to decrease his Lipitor to 20 mg to see if that made a difference.  Struggling some with his right shoulder.  He says his pain is not nearly as intense as it was he still getting a lot of popping and cracking but has been able to function and work.  He did get a new job since I last saw him.  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  And he is actually been much happier he had a normal work schedule, has been eating better sleeping better.  His headaches have significantly improved now that he is getting quality sleep.     ROS    Objective:     BP 134/81   Pulse (!) 51   Ht 5\' 10"  (1.778 m)   Wt 182 lb (82.6 kg)   SpO2 99%   BMI 26.11 kg/m    Physical Exam Constitutional:      Appearance: He is well-developed.  HENT:     Head: Normocephalic and atraumatic.  Cardiovascular:     Rate and Rhythm: Normal rate and regular rhythm.     Heart sounds: Normal heart sounds.  Pulmonary:     Effort: Pulmonary effort is normal.     Breath sounds: Normal breath sounds.  Skin:    General: Skin is warm and dry.  Neurological:     Mental Status: He is alert and oriented to person, place, and time.  Psychiatric:        Behavior: Behavior normal.      No results found for any visits on 08/08/22.    The ASCVD Risk score (Arnett DK, et al., 2019) failed to calculate  for the following reasons:   The patient has a prior MI or stroke diagnosis    Assessment & Plan:   Problem List Items Addressed This Visit       Cardiovascular and Mediastinum   Hypertension    Blood pressure looks good today we will continue to work on healthy diet and regular exercise.  He is actually lost about 10 pounds and is more physically active at his new job which is great.        Musculoskeletal and Integument   Rotator cuff tear, right    Still having some pain and popping and cracking but is not nearly as bad as it was.  He will let me know how he is doing next time I see him he can always call if he is having increased pain or concerns.        Other   Pure hypercholesterolemia    He is now on decreased dose of atorvastatin we will plan to recheck levels in the spring.  His LDL needs to be less than 70 otherwise we can consider going back up to 40 mg on the atorvastatin, or switch to  Crestor 20 mg per cardiology notes.      Iron deficiency    Lan to recheck iron in the spring.      Other Visit Diagnoses     Essential hypertension    -  Primary       Flu Vaccine given today.  Return in about 6 months (around 02/06/2023) for Hypertension.    Nani Gasser, MD

## 2023-02-13 ENCOUNTER — Encounter: Payer: Self-pay | Admitting: Family Medicine

## 2023-02-13 ENCOUNTER — Ambulatory Visit: Payer: BC Managed Care – PPO | Admitting: Family Medicine

## 2023-02-13 VITALS — BP 110/68 | HR 62 | Wt 176.0 lb

## 2023-02-13 DIAGNOSIS — I252 Old myocardial infarction: Secondary | ICD-10-CM

## 2023-02-13 DIAGNOSIS — E611 Iron deficiency: Secondary | ICD-10-CM | POA: Diagnosis not present

## 2023-02-13 DIAGNOSIS — E785 Hyperlipidemia, unspecified: Secondary | ICD-10-CM | POA: Diagnosis not present

## 2023-02-13 DIAGNOSIS — I10 Essential (primary) hypertension: Secondary | ICD-10-CM

## 2023-02-13 NOTE — Assessment & Plan Note (Signed)
Pressure looks fantastic today.  Due for updated labs.  Plan to follow-up in 6 months.

## 2023-02-13 NOTE — Assessment & Plan Note (Signed)
Was previously on iron supplement.  Would like to have iron levels rechecked to make sure that they are adequate.

## 2023-02-13 NOTE — Assessment & Plan Note (Signed)
Continue aspirin, statin, and beta-blocker.  Follows with cardiology yearly.

## 2023-02-13 NOTE — Assessment & Plan Note (Signed)
To recheck lipid panel to make sure that it is at goal.  He will have follow-up with cardiology later this summer.

## 2023-02-13 NOTE — Progress Notes (Signed)
   Established Patient Office Visit  Subjective   Patient ID: Darrell Bridges, male    DOB: 26-Sep-1955  Age: 67 y.o. MRN: [id number]  Chief Complaint  Patient presents with   Hypertension    HPI He has a new job now.  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  He now has normal hours and is not working at night.  He is a lot more physically active which has been great.  The pain is not as good but he had planned on slowing down for retirement anyway.  He will be going on Medicare in the fall.  Hyperlipidemia - tolerating stating well with no myalgias or significant side effects.  Lab Results  Component Value Date   CHOL 130 01/05/2022   HDL 54 01/05/2022   LDLCALC 62 01/05/2022   LDLDIRECT 81 11/20/2012   TRIG 64 01/05/2022   CHOLHDL 2.4 01/05/2022    Hypertension- Pt denies chest pain, SOB, dizziness, or heart palpitations.  Taking meds as directed w/o problems.  Denies medication side effects.      ROS    Objective:     BP 110/68   Pulse 62   Wt 176 lb (79.8 kg)   SpO2 97%   BMI 25.25 kg/m    Physical Exam Constitutional:      Appearance: He is well-developed.  HENT:     Head: Normocephalic and atraumatic.  Cardiovascular:     Rate and Rhythm: Normal rate and regular rhythm.     Heart sounds: Normal heart sounds.  Pulmonary:     Effort: Pulmonary effort is normal.     Breath sounds: Normal breath sounds.  Skin:    General: Skin is warm and dry.  Neurological:     Mental Status: He is alert and oriented to person, place, and time.  Psychiatric:        Behavior: Behavior normal.      No results found for any visits on 02/13/23.    The 10-year ASCVD risk score (Arnett DK, et al., 2019) is: 7.4%    Assessment & Plan:   Problem List Items Addressed This Visit       Cardiovascular and Mediastinum   Hypertension    Pressure looks fantastic today.  Due for updated labs.  Plan to follow-up in 6 months.        Other   Iron  deficiency    Was previously on iron supplement.  Would like to have iron levels rechecked to make sure that they are adequate.      Relevant Orders   TSH   Iron   Hyperlipidemia LDL goal <70    To recheck lipid panel to make sure that it is at goal.  He will have follow-up with cardiology later this summer.      History of MI (myocardial infarction)    Continue aspirin, statin, and beta-blocker.  Follows with cardiology yearly.      Other Visit Diagnoses     Essential hypertension    -  Primary   Relevant Orders   PSA   Lipid panel   COMPLETE METABOLIC PANEL WITH GFR   CBC   TSH   Iron      For shingles vaccine but he will be free in the fall once he transitions over to Medicare.  No follow-ups on file.    Nani Gasser, MD

## 2023-03-03 ENCOUNTER — Other Ambulatory Visit: Payer: Self-pay | Admitting: Family Medicine

## 2023-03-03 DIAGNOSIS — E785 Hyperlipidemia, unspecified: Secondary | ICD-10-CM

## 2023-03-03 DIAGNOSIS — I10 Essential (primary) hypertension: Secondary | ICD-10-CM

## 2023-03-03 DIAGNOSIS — I2510 Atherosclerotic heart disease of native coronary artery without angina pectoris: Secondary | ICD-10-CM

## 2023-03-05 ENCOUNTER — Telehealth: Payer: Self-pay | Admitting: Family Medicine

## 2023-03-05 DIAGNOSIS — I10 Essential (primary) hypertension: Secondary | ICD-10-CM

## 2023-03-05 MED ORDER — LOSARTAN POTASSIUM 25 MG PO TABS
25.0000 mg | ORAL_TABLET | Freq: Every day | ORAL | 3 refills | Status: DC
Start: 2023-03-05 — End: 2023-12-07

## 2023-03-05 MED ORDER — METOPROLOL SUCCINATE ER 25 MG PO TB24
25.0000 mg | ORAL_TABLET | Freq: Every day | ORAL | 1 refills | Status: DC
Start: 2023-03-05 — End: 2023-08-26

## 2023-03-05 NOTE — Telephone Encounter (Signed)
Pt called office requesting refills on   metoprolol succinate (TOPROL-XL) 25 MG 24 hr tablet and   losartan (COZAAR) 25 MG tablet to be sent to the pharmacy. Please send Rx to  [facility] [phone] - [place], [place] - [address] Phone: [phone]  Fax: [phone]

## 2023-03-05 NOTE — Telephone Encounter (Signed)
Left message on Pt voicemail that Rx(s) has been sent to pharmacy per his request.

## 2023-03-05 NOTE — Telephone Encounter (Signed)
done

## 2023-03-08 LAB — COMPLETE METABOLIC PANEL WITH GFR
Albumin: 4.2 g/dL (ref 3.6–5.1)
CO2: 29 mmol/L (ref 20–32)
Globulin: 2.2 g/dL (calc) (ref 1.9–3.7)
Total Bilirubin: 0.9 mg/dL (ref 0.2–1.2)
eGFR: 77 mL/min/{1.73_m2} (ref >=60)

## 2023-03-08 LAB — CBC
MPV: 12.1 fL (ref 7.5–12.5)
RBC: 3.89 10*6/uL — ABNORMAL LOW (ref 4.20–5.80)

## 2023-03-08 LAB — IRON: Iron: 135 ug/dL (ref 50–180)

## 2023-03-08 NOTE — Progress Notes (Signed)
Hi Argus, your hemoglobin is back down to 12.  Similar to 2 years ago.Have you noticed any blood in the urine or stool?  Platelets are borderline low but not in a worrisome range. Cholesterol at goal. Prostate test is normal. Metabolic panel is normal.  Total iron looks good.Thyroid is OK.   Call lab and see can add ferritin and B12.

## 2023-03-10 LAB — LIPID PANEL
Cholesterol: 127 mg/dL (ref <200)
HDL: 62 mg/dL (ref >=40)
LDL Cholesterol (Calc): 53 mg/dL (calc)
Non-HDL Cholesterol (Calc): 65 mg/dL (calc) (ref <130)
Total CHOL/HDL Ratio: 2 (calc) (ref <5.0)
Triglycerides: 50 mg/dL (ref <150)

## 2023-03-10 LAB — TSH: TSH: 4.38 mIU/L (ref 0.40–4.50)

## 2023-03-10 LAB — COMPLETE METABOLIC PANEL WITH GFR
AG Ratio: 1.9 (calc) (ref 1.0–2.5)
ALT: 20 U/L (ref 9–46)
AST: 24 U/L (ref 10–35)
Alkaline phosphatase (APISO): 55 U/L (ref 35–144)
BUN: 20 mg/dL (ref 7–25)
Calcium: 9.1 mg/dL (ref 8.6–10.3)
Chloride: 103 mmol/L (ref 98–110)
Creat: 1.08 mg/dL (ref 0.70–1.35)
Glucose, Bld: 85 mg/dL (ref 65–99)
Potassium: 4.2 mmol/L (ref 3.5–5.3)
Sodium: 139 mmol/L (ref 135–146)
Total Protein: 6.4 g/dL (ref 6.1–8.1)

## 2023-03-10 LAB — VITAMIN B12: Vitamin B-12: 329 pg/mL (ref 200–1100)

## 2023-03-10 LAB — CBC
HCT: 37.5 % — ABNORMAL LOW (ref 38.5–50.0)
Hemoglobin: 12.9 g/dL — ABNORMAL LOW (ref 13.2–17.1)
MCH: 33.2 pg — ABNORMAL HIGH (ref 27.0–33.0)
MCHC: 34.4 g/dL (ref 32.0–36.0)
MCV: 96.4 fL (ref 80.0–100.0)
Platelets: 125 10*3/uL — ABNORMAL LOW (ref 140–400)
RDW: 12.2 % (ref 11.0–15.0)
WBC: 4.6 10*3/uL (ref 3.8–10.8)

## 2023-03-10 LAB — PSA: PSA: 0.28 ng/mL (ref <=4.00)

## 2023-03-10 LAB — FERRITIN: Ferritin: 19 ng/mL — ABNORMAL LOW (ref 24–380)

## 2023-03-13 NOTE — Progress Notes (Signed)
Ok to submit code for medication monitoring, Abnormal TSH andm thrombocytopenia

## 2023-03-13 NOTE — Progress Notes (Signed)
HI Darrell Bridges your ferritin, a reflection of your iron stores is deficient. I woiuld like to refer you to hematology for futher work up for why your iron would be low.  If you are OK with that please let us know so we can get your scheduled at our [facility] location.

## 2023-03-21 ENCOUNTER — Other Ambulatory Visit: Payer: Self-pay

## 2023-03-21 DIAGNOSIS — E611 Iron deficiency: Secondary | ICD-10-CM

## 2023-03-21 NOTE — Progress Notes (Signed)
Lets do this, lets have him take a over-the-counter iron tablet daily for 6 weeks and then plan to recheck his iron panel and CBC and will take a look and see if it looks like it is improving.  If he is not absorbing it for some reason then we will consider the heme-onc referral.

## 2023-03-21 NOTE — Progress Notes (Signed)
That strength is perfect! Thank you

## 2023-05-01 ENCOUNTER — Other Ambulatory Visit: Payer: Self-pay

## 2023-05-01 DIAGNOSIS — E611 Iron deficiency: Secondary | ICD-10-CM

## 2023-05-02 NOTE — Progress Notes (Signed)
HI Darrell Bridges, your ferritin, iron stores are looking much better. Up to 45.  And the size of your red blood cells is improving.  Would like for you to continue the iron for another 3 months and then we can probably discontinue it at that point, and then keep an eye on it.

## 2023-05-22 ENCOUNTER — Encounter: Payer: Self-pay | Admitting: Family Medicine

## 2023-05-22 ENCOUNTER — Ambulatory Visit: Payer: BC Managed Care – PPO | Admitting: Family Medicine

## 2023-05-22 ENCOUNTER — Telehealth: Payer: Self-pay | Admitting: Family Medicine

## 2023-05-22 VITALS — BP 118/73 | HR 58 | Ht 70.0 in | Wt 174.0 lb

## 2023-05-22 DIAGNOSIS — R3129 Other microscopic hematuria: Secondary | ICD-10-CM | POA: Diagnosis not present

## 2023-05-22 NOTE — Progress Notes (Unsigned)
   Established Patient Office Visit  Subjective   Patient ID: Darrell Bridges, male    DOB: 24-Nov-1955  Age: 67 y.o. 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  They did a urinalysis as part of the evaluation and it showed moderate amount of blood.  He has not noticed any gross hematuria and we in fact have actually been treating him for low ferritin levels he is been taking an iron supplement.  At that time when we first detected that on his labs that he had a drop in his hemoglobin he had not had any gross hematuria or blood in the stool.  And his colonoscopy which was performed in 2019 was up-to-date.  He says occasionally he will get some right flank pain.  This usually if he has not had enough to drink.  It started after he passed a kidney stone back in his 30s.  But is never passed another kidney stone since then.  {History (Optional):23778}  ROS    Objective:     BP 118/73   Pulse (!) 58   Ht 5\' 10"  (1.778 m)   Wt 174 lb (78.9 kg)   SpO2 99%   BMI 24.97 kg/m  {Vitals History (Optional):23777}  Physical Exam Vitals reviewed.  Constitutional:      Appearance: Normal appearance.  HENT:     Head: Normocephalic.  Pulmonary:     Effort: Pulmonary effort is normal.  Neurological:     Mental Status: He is alert and oriented to person, place, and time.  Psychiatric:        Mood and Affect: Mood normal.        Behavior: Behavior normal.      No results found for any visits on 05/22/23.  {Labs (Optional):23779}  The ASCVD Risk score (Arnett DK, et al., 2019) failed to calculate for the following reasons:   The patient has a prior MI or stroke diagnosis    Assessment & Plan:   Problem List Items Addressed This Visit   None Visit Diagnoses     Microscopic hematuria    -  Primary   Relevant Orders   Urinalysis, microscopic only      Microscopic  hematuria-we discussed the repeat urinalysis today does show small amount of blood we will send it for microscopic review for further workup if it does come back positive then we will need to do a workup for hematuria.  Interestingly he does have a prior history of kidney stones but has not passed 1 since he was in his 75s.  Interestingly he did have a drop in his hemoglobin in June and had dropped down to 12.9.  2 years prior it had been 13.  No follow-ups on file.    Nani Gasser, MD

## 2023-05-22 NOTE — Telephone Encounter (Signed)
Orders Placed This Encounter  Procedures  . Urinalysis, microscopic only

## 2023-05-23 LAB — URINALYSIS, MICROSCOPIC ONLY
Bacteria, UA: NONE SEEN
Casts: NONE SEEN /LPF
Epithelial Cells (non renal): NONE SEEN /HPF (ref 0–10)
WBC, UA: NONE SEEN /HPF (ref 0–5)

## 2023-05-23 NOTE — Progress Notes (Signed)
Hi Edison, they did see 11-30 red blood cells per high-powered field this is more than the 3 that we talked about yesterday.  This point I am going to go ahead and place a referral especially since we do not see any other sign of proteinuria or infection.  Orders Placed This Encounter     Urinalysis, microscopic only     Ambulatory referral to Urology         Referral Priority:Routine         Referral Type:Consultation         Referral Reason:Specialty Services Required         Requested Specialty:Urology         Number of Visits Requested:1

## 2023-05-23 NOTE — Addendum Note (Signed)
Addended by: Nani Gasser D on: 05/23/2023 02:28 PM   Modules accepted: Orders

## 2023-06-06 ENCOUNTER — Ambulatory Visit (INDEPENDENT_AMBULATORY_CARE_PROVIDER_SITE_OTHER): Payer: Medicare HMO | Admitting: Urology

## 2023-06-06 ENCOUNTER — Encounter: Payer: Self-pay | Admitting: Urology

## 2023-06-06 VITALS — BP 137/81 | HR 66 | Ht 70.0 in | Wt 175.0 lb

## 2023-06-06 DIAGNOSIS — R3129 Other microscopic hematuria: Secondary | ICD-10-CM

## 2023-06-06 DIAGNOSIS — N201 Calculus of ureter: Secondary | ICD-10-CM

## 2023-06-06 DIAGNOSIS — Z87442 Personal history of urinary calculi: Secondary | ICD-10-CM

## 2023-06-06 LAB — URINALYSIS, ROUTINE W REFLEX MICROSCOPIC
Bilirubin, UA: NEGATIVE
Glucose, UA: NEGATIVE
Ketones, UA: NEGATIVE
Leukocytes,UA: NEGATIVE
Nitrite, UA: NEGATIVE
Protein,UA: NEGATIVE
Specific Gravity, UA: 1.015 (ref 1.005–1.030)
Urobilinogen, Ur: 0.2 mg/dL (ref 0.2–1.0)
pH, UA: 7 (ref 5.0–7.5)

## 2023-06-06 LAB — MICROSCOPIC EXAMINATION

## 2023-06-06 NOTE — Progress Notes (Signed)
Assessment: 1. Microscopic hematuria   2. History of nephrolithiasis     Plan: I personally reviewed the patient's chart including provider notes, and lab results. Today I had a discussion with the patient regarding the findings of microscopic hematuria including the implications and differential diagnoses associated with it.  I also discussed recommendations for further evaluation including the rationale for upper tract imaging and cystoscopy.  I discussed the nature of these procedures including potential risk and complications.  The patient expressed an understanding of these issues. Schedule for CT hematuria study followed by possible cystoscopy.  Chief Complaint:  Chief Complaint  Patient presents with   Hematuria    History of Present Illness:  Darrell Bridges is a 67 y.o. male who is seen in consultation from Agapito Games, MD for evaluation of microscopic hematuria. Urinalysis from 05/23/2023 showed 11-30 RBCs. He had onset of left-sided flank pain approximately 1 day after this urinalysis was done.  He reports his symptoms were similar to a prior stone episode from 20 years ago.  His symptoms have since resolved.  He is not aware of passing a stone.  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  No dysuria or gross hematuria.  No history of UTIs. He has a history of nephrolithiasis 20 years ago, spontaneously passing the stone. No recent imaging has been performed. He has a remote history of tobacco use, quitting in 1996. No lower urinary tract symptoms other than nocturia x 2. IPSS = 2. PSA from 6/24: 0.28   Past Medical History:  Past Medical History:  Diagnosis Date   CAD (coronary artery disease)    Hyperlipidemia    Hypertension    Nephrolithiasis     Past Surgical History:  No past surgical history on file.  Allergies:  Allergies  Allergen Reactions   Lisinopril     Other reaction(s): Cough    Family History:  Family  History  Problem Relation Age of Onset   Heart attack Father        Died at 32 of MI   Diabetes Mother    Hyperlipidemia Other    Hypertension Mother    Stroke Other    Thyroid disease Brother     Social History:  Social History   Tobacco Use   Smoking status: Former   Smokeless tobacco: Never  Substance Use Topics   Alcohol use: Yes    Comment: Rarely   Drug use: No    Review of symptoms:  Constitutional:  Negative for unexplained weight loss, night sweats, fever, chills ENT:  Negative for nose bleeds, sinus pain, painful swallowing CV:  Negative for chest pain, shortness of breath, exercise intolerance, palpitations, loss of consciousness Resp:  Negative for cough, wheezing, shortness of breath GI:  Negative for nausea, vomiting, diarrhea, bloody stools GU:  Positives noted in HPI; otherwise negative for gross hematuria, dysuria, urinary incontinence Neuro:  Negative for seizures, poor balance, limb weakness, slurred speech Psych:  Negative for lack of energy, depression, anxiety Endocrine:  Negative for polydipsia, polyuria, symptoms of hypoglycemia (dizziness, hunger, sweating) Hematologic:  Negative for anemia, purpura, petechia, prolonged or excessive bleeding, use of anticoagulants  Allergic:  Negative for difficulty breathing or choking as a result of exposure to anything; no shellfish allergy; no allergic response (rash/itch) to materials, foods  Physical exam: BP 137/81   Pulse 66   Ht 5\' 10"  (1.778 m)   Wt 175 lb (79.4 kg)   BMI 25.11 kg/m  GENERAL APPEARANCE:  Well appearing, well developed, well nourished, NAD HEENT: Atraumatic, Normocephalic, oropharynx clear. NECK: Supple without lymphadenopathy or thyromegaly. LUNGS: Clear to auscultation bilaterally. HEART: Regular Rate and Rhythm without murmurs, gallops, or rubs. ABDOMEN: Soft, non-tender, No Masses. EXTREMITIES: Moves all extremities well.  Without clubbing, cyanosis, or edema. NEUROLOGIC:  Alert  and oriented x 3, normal gait, CN II-XII grossly intact.  MENTAL STATUS:  Appropriate. BACK:  Non-tender to palpation.  No CVAT SKIN:  Warm, dry and intact.    Results: U/A:  11-30 RBCs

## 2023-06-13 ENCOUNTER — Ambulatory Visit (HOSPITAL_BASED_OUTPATIENT_CLINIC_OR_DEPARTMENT_OTHER)
Admission: RE | Admit: 2023-06-13 | Discharge: 2023-06-13 | Disposition: A | Discharge status: Home / Self Care | Payer: Medicare HMO | Source: Ambulatory Visit | Attending: Urology | Admitting: Urology

## 2023-06-13 DIAGNOSIS — R3129 Other microscopic hematuria: Secondary | ICD-10-CM | POA: Insufficient documentation

## 2023-06-13 MED ORDER — IOHEXOL 300 MG/ML  SOLN
125.0000 mL | Freq: Once | INTRAMUSCULAR | Status: AC | PRN
  Administered 2023-06-13: 125 mL via INTRAVENOUS

## 2023-06-21 ENCOUNTER — Ambulatory Visit (INDEPENDENT_AMBULATORY_CARE_PROVIDER_SITE_OTHER): Payer: Medicare HMO | Admitting: Urology

## 2023-06-21 VITALS — BP 136/82 | HR 62 | Ht 70.0 in | Wt 175.0 lb

## 2023-06-21 DIAGNOSIS — R3129 Other microscopic hematuria: Secondary | ICD-10-CM | POA: Diagnosis not present

## 2023-06-21 DIAGNOSIS — N200 Calculus of kidney: Secondary | ICD-10-CM

## 2023-06-21 MED ORDER — CIPROFLOXACIN HCL 500 MG PO TABS
500.0000 mg | ORAL_TABLET | Freq: Once | ORAL | Status: AC
Start: 2023-06-21 — End: 2023-06-21
  Administered 2023-06-21: 500 mg via ORAL

## 2023-06-21 NOTE — Progress Notes (Signed)
Assessment: 1. Microscopic hematuria   2. Nephrolithiasis     Plan: I personally reviewed the CT study from 06/13/2023. I am suspicious of a possible proximal left ureteral calculus and would like to review this with radiology. I discussed these results with the patient. No serious or life-threatening cause for the microscopic hematuria. Cipro x 1 following cystoscopy.  Chief Complaint:  Chief Complaint  Patient presents with   Cysto    History of Present Illness:  Darrell Bridges is a 67 y.o. male who is seen for continued evaluation of microscopic hematuria. Urinalysis from 05/23/2023 showed 11-30 RBCs. He had onset of left-sided flank pain approximately 1 day after this urinalysis was done.  He reports his symptoms were similar to a prior stone episode from 20 years ago.  His symptoms have since resolved.  He is not aware of passing a stone.  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  No dysuria or gross hematuria.  No history of UTIs. He has a history of nephrolithiasis 20 years ago, spontaneously passing the stone. No recent imaging has been performed. He has a remote history of tobacco use, quitting in 1996. No lower urinary tract symptoms other than nocturia x 2. IPSS = 2. PSA from 6/24: 0.28 U/A showed 3-10 RBCs.  CT hematuria protocol from 06/21/2023 showed small nonobstructive bilateral renal calculi, no renal mass or obstruction, diffuse thickening of the urinary bladder, no obvious filling defects noted. He presents today for cystoscopy for further evaluation of his microscopic hematuria.  Portions of the above documentation were copied from a prior visit for review purposes only.   Past Medical History:  Past Medical History:  Diagnosis Date   CAD (coronary artery disease)    Hyperlipidemia    Hypertension    Nephrolithiasis     Past Surgical History:  No past surgical history on file.  Allergies:  Allergies  Allergen  Reactions   Lisinopril     Other reaction(s): Cough    Family History:  Family History  Problem Relation Age of Onset   Heart attack Father        Died at 53 of MI   Diabetes Mother    Hyperlipidemia Other    Hypertension Mother    Stroke Other    Thyroid disease Brother     Social History:  Social History   Tobacco Use   Smoking status: Former   Smokeless tobacco: Never  Substance Use Topics   Alcohol use: Yes    Comment: Rarely   Drug use: No    ROS: Constitutional:  Negative for fever, chills, weight loss CV: Negative for chest pain, previous MI, hypertension Respiratory:  Negative for shortness of breath, wheezing, sleep apnea, frequent cough GI:  Negative for nausea, vomiting, bloody stool, GERD  Physical exam: BP 136/82   Pulse 62   Ht 5\' 10"  (1.778 m)   Wt 175 lb (79.4 kg)   BMI 25.11 kg/m  GENERAL APPEARANCE:  Well appearing, well developed, well nourished, NAD HEENT:  Atraumatic, normocephalic, oropharynx clear NECK:  Supple without lymphadenopathy or thyromegaly ABDOMEN:  Soft, non-tender, no masses EXTREMITIES:  Moves all extremities well, without clubbing, cyanosis, or edema NEUROLOGIC:  Alert and oriented x 3, normal gait, CN II-XII grossly intact MENTAL STATUS:  appropriate BACK:  Non-tender to palpation, No CVAT SKIN:  Warm, dry, and intact  Results: U/A:  3-10 RBC  Procedure:  Flexible Cystourethroscopy  Pre-operative Diagnosis: Microscopic hematuria  Post-operative Diagnosis:  Microscopic hematuria  Anesthesia:  local with lidocaine jelly  Surgical Narrative:  After appropriate informed consent was obtained, the patient was prepped and draped in the usual sterile fashion in the supine position.  The patient was correctly identified and the proper procedure delineated prior to proceeding.  Sterile lidocaine gel was instilled in the urethra. The flexible cystoscope was introduced without difficulty.  Findings:  Anterior urethra:  Normal  Posterior urethra: Lateral lobe hypertrophy  Bladder: Normal  Ureteral orifices: normal  Additional findings: none  Saline bladder wash for cytology was not performed.    The cystoscope was then removed.  The patient tolerated the procedure well.

## 2023-06-25 LAB — URINALYSIS, ROUTINE W REFLEX MICROSCOPIC
Bilirubin, UA: NEGATIVE
Glucose, UA: NEGATIVE
Ketones, UA: NEGATIVE
Leukocytes,UA: NEGATIVE
Nitrite, UA: NEGATIVE
Protein,UA: NEGATIVE
Specific Gravity, UA: 1.02 (ref 1.005–1.030)
Urobilinogen, Ur: 0.2 mg/dL (ref 0.2–1.0)
pH, UA: 6 (ref 5.0–7.5)

## 2023-06-25 LAB — MICROSCOPIC EXAMINATION
Bacteria, UA: NONE SEEN
Epithelial Cells (non renal): NONE SEEN /[HPF] (ref 0–10)
WBC, UA: NONE SEEN /[HPF] (ref 0–5)

## 2023-06-26 ENCOUNTER — Encounter: Payer: Self-pay | Admitting: Urology

## 2023-06-26 NOTE — Addendum Note (Signed)
Addended by: Milderd Meager on: 06/26/2023 04:31 PM   Modules accepted: Orders

## 2023-06-27 ENCOUNTER — Ambulatory Visit (HOSPITAL_BASED_OUTPATIENT_CLINIC_OR_DEPARTMENT_OTHER)
Admission: RE | Admit: 2023-06-27 | Discharge: 2023-06-27 | Disposition: A | Discharge status: Home / Self Care | Payer: BC Managed Care – PPO | Source: Ambulatory Visit | Attending: Urology | Admitting: Urology

## 2023-06-27 DIAGNOSIS — N201 Calculus of ureter: Secondary | ICD-10-CM | POA: Insufficient documentation

## 2023-07-02 ENCOUNTER — Encounter: Payer: Self-pay | Admitting: Urology

## 2023-07-02 ENCOUNTER — Telehealth: Payer: Self-pay | Admitting: Urology

## 2023-07-02 NOTE — Telephone Encounter (Signed)
Would like to know results from xray he had earlier this month.

## 2023-07-05 ENCOUNTER — Ambulatory Visit (HOSPITAL_BASED_OUTPATIENT_CLINIC_OR_DEPARTMENT_OTHER)
Admission: RE | Admit: 2023-07-05 | Discharge: 2023-07-05 | Disposition: A | Discharge status: Home / Self Care | Payer: BC Managed Care – PPO | Source: Ambulatory Visit | Attending: Urology | Admitting: Urology

## 2023-07-05 ENCOUNTER — Encounter: Payer: Self-pay | Admitting: Urology

## 2023-07-05 ENCOUNTER — Ambulatory Visit (INDEPENDENT_AMBULATORY_CARE_PROVIDER_SITE_OTHER): Payer: Medicare HMO | Admitting: Urology

## 2023-07-05 VITALS — BP 132/81 | HR 55 | Ht 70.0 in | Wt 175.0 lb

## 2023-07-05 DIAGNOSIS — N201 Calculus of ureter: Secondary | ICD-10-CM

## 2023-07-05 DIAGNOSIS — N200 Calculus of kidney: Secondary | ICD-10-CM

## 2023-07-05 DIAGNOSIS — R3129 Other microscopic hematuria: Secondary | ICD-10-CM

## 2023-07-05 NOTE — H&P (View-Only) (Signed)
Assessment: 1. Ureteral calculus; left, proximal, 6 mm   2. Nephrolithiasis   3. Microscopic hematuria     Plan: I reviewed the KUB study from 06/27/2023 and the study from today. There appears to be a 6 mm calcification on the left between L2 and L3 consistent with the known ureteral calculus.  The previously noted calcification in the left renal shadow from the study on 06/27/2023 is not easily visualized today. I discussed these findings with the patient in detail today and reviewed the films with him. Given the persistence of the calculus in the proximal left ureter, I have recommended treatment with either shockwave lithotripsy or ureteroscopic laser lithotripsy.  I discussed both procedures in detail.  Following our discussion, he would like to proceed with shockwave lithotripsy.  Procedure: The patient will be scheduled for left ESL at [facility].  Surgical request is placed with the surgery schedulers and will be scheduled at the patient's/family request. Informed consent is given as documented below. Anesthesia:  local with IV sedation  The patient does not have sleep apnea, history of MRSA, history of VRE, history of cardiac device requiring special anesthetic needs. Patient is stable and considered clear for surgical in an outpatient ambulatory surgery setting as well as patient hospital setting.  Consent for Operation or Procedure: Provider Certification I hereby certify that the nature, purpose, benefits, usual and most frequent risks of, and alternatives to, the operation or procedure have been explained to the patient (or person authorized to sign for the patient) either by me as responsible physician or by the provider who is to perform the operation or procedure. Time spent such that the patient/family has had an opportunity to ask questions, and that those questions have been answered. The patient or the patient's representative has been advised that selected  tasks may be performed by assistants to the primary health care provider(s). I believe that the patient (or person authorized to sign for the patient) understands what has been explained, and has consented to the operation or procedure. No guarantees were implied or made.   Chief Complaint:  Chief Complaint  Patient presents with   Nephrolithiasis    History of Present Illness:  Darrell Bridges is a 67 y.o. male who is seen for continued evaluation of microscopic hematuria. Urinalysis from 05/23/2023 showed 11-30 RBCs. He had onset of left-sided flank pain approximately 1 day after this urinalysis was done.  He reports his symptoms were similar to a prior stone episode from 20 years ago.  His symptoms have since resolved.  He is not aware of passing a stone.  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  No dysuria or gross hematuria.  No history of UTIs. He has a history of nephrolithiasis 20 years ago, spontaneously passing the stone. No recent imaging has been performed. He has a remote history of tobacco use, quitting in 1996. No lower urinary tract symptoms other than nocturia x 2. IPSS = 2. PSA from 6/24: 0.28 U/A showed 3-10 RBCs.  CT hematuria protocol from 06/21/2023 showed small nonobstructive bilateral renal calculi, no renal mass or obstruction, diffuse thickening of the urinary bladder, and a 6 mm proximal left ureteral calculus. Cystoscopy from 9/24 showed lateral lobe enlargement of the prostate but no mucosal abnormalities. KUB from 06/27/2023 showed a 6 mm calcification overlying the left renal shadow.  He returns today for repeat imaging and to discuss management options for the left ureteral calculus.  He  is not having any flank pain.  No gross hematuria or dysuria.   Portions of the above documentation were copied from a prior visit for review purposes only.   Past Medical History:  Past Medical History:  Diagnosis Date   CAD (coronary  artery disease)    Hyperlipidemia    Hypertension    Nephrolithiasis     Past Surgical History:  No past surgical history on file.  Allergies:  Allergies  Allergen Reactions   Lisinopril     Other reaction(s): Cough    Family History:  Family History  Problem Relation Age of Onset   Heart attack Father        Died at 67 of MI   Diabetes Mother    Hyperlipidemia Other    Hypertension Mother    Stroke Other    Thyroid disease Brother     Social History:  Social History   Tobacco Use   Smoking status: Former   Smokeless tobacco: Never  Substance Use Topics   Alcohol use: Yes    Comment: Rarely   Drug use: No    ROS: Constitutional:  Negative for fever, chills, weight loss CV: Negative for chest pain, previous MI, hypertension Respiratory:  Negative for shortness of breath, wheezing, sleep apnea, frequent cough GI:  Negative for nausea, vomiting, bloody stool, GERD  Physical exam: BP 132/81   Pulse (!) 55   Ht 5\' 10"  (1.778 m)   Wt 175 lb (79.4 kg)   BMI 25.11 kg/m  GENERAL APPEARANCE:  Well appearing, well developed, well nourished, NAD HEENT:  Atraumatic, normocephalic, oropharynx clear NECK:  Supple without lymphadenopathy or thyromegaly ABDOMEN:  Soft, non-tender, no masses EXTREMITIES:  Moves all extremities well, without clubbing, cyanosis, or edema NEUROLOGIC:  Alert and oriented x 3, normal gait, CN II-XII grossly intact MENTAL STATUS:  appropriate BACK:  Non-tender to palpation, No CVAT SKIN:  Warm, dry, and intact  Results: U/A:  3-10 RBC

## 2023-07-05 NOTE — Progress Notes (Signed)
Assessment: 1. Ureteral calculus; left, proximal, 6 mm   2. Nephrolithiasis   3. Microscopic hematuria     Plan: I reviewed the KUB study from 06/27/2023 and the study from today. There appears to be a 6 mm calcification on the left between L2 and L3 consistent with the known ureteral calculus.  The previously noted calcification in the left renal shadow from the study on 06/27/2023 is not easily visualized today. I discussed these findings with the patient in detail today and reviewed the films with him. Given the persistence of the calculus in the proximal left ureter, I have recommended treatment with either shockwave lithotripsy or ureteroscopic laser lithotripsy.  I discussed both procedures in detail.  Following our discussion, he would like to proceed with shockwave lithotripsy.  Procedure: The patient will be scheduled for left ESL at [facility].  Surgical request is placed with the surgery schedulers and will be scheduled at the patient's/family request. Informed consent is given as documented below. Anesthesia:  local with IV sedation  The patient does not have sleep apnea, history of MRSA, history of VRE, history of cardiac device requiring special anesthetic needs. Patient is stable and considered clear for surgical in an outpatient ambulatory surgery setting as well as patient hospital setting.  Consent for Operation or Procedure: Provider Certification I hereby certify that the nature, purpose, benefits, usual and most frequent risks of, and alternatives to, the operation or procedure have been explained to the patient (or person authorized to sign for the patient) either by me as responsible physician or by the provider who is to perform the operation or procedure. Time spent such that the patient/family has had an opportunity to ask questions, and that those questions have been answered. The patient or the patient's representative has been advised that selected  tasks may be performed by assistants to the primary health care provider(s). I believe that the patient (or person authorized to sign for the patient) understands what has been explained, and has consented to the operation or procedure. No guarantees were implied or made.   Chief Complaint:  Chief Complaint  Patient presents with   Nephrolithiasis    History of Present Illness:  Darrell Bridges is a 67 y.o. male who is seen for continued evaluation of microscopic hematuria. Urinalysis from 05/23/2023 showed 11-30 RBCs. He had onset of left-sided flank pain approximately 1 day after this urinalysis was done.  He reports his symptoms were similar to a prior stone episode from 20 years ago.  His symptoms have since resolved.  He is not aware of passing a stone.  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  No dysuria or gross hematuria.  No history of UTIs. He has a history of nephrolithiasis 20 years ago, spontaneously passing the stone. No recent imaging has been performed. He has a remote history of tobacco use, quitting in 1996. No lower urinary tract symptoms other than nocturia x 2. IPSS = 2. PSA from 6/24: 0.28 U/A showed 3-10 RBCs.  CT hematuria protocol from 06/21/2023 showed small nonobstructive bilateral renal calculi, no renal mass or obstruction, diffuse thickening of the urinary bladder, and a 6 mm proximal left ureteral calculus. Cystoscopy from 9/24 showed lateral lobe enlargement of the prostate but no mucosal abnormalities. KUB from 06/27/2023 showed a 6 mm calcification overlying the left renal shadow.  He returns today for repeat imaging and to discuss management options for the left ureteral calculus.  He  is not having any flank pain.  No gross hematuria or dysuria.   Portions of the above documentation were copied from a prior visit for review purposes only.   Past Medical History:  Past Medical History:  Diagnosis Date   CAD (coronary  artery disease)    Hyperlipidemia    Hypertension    Nephrolithiasis     Past Surgical History:  No past surgical history on file.  Allergies:  Allergies  Allergen Reactions   Lisinopril     Other reaction(s): Cough    Family History:  Family History  Problem Relation Age of Onset   Heart attack Father        Died at 67 of MI   Diabetes Mother    Hyperlipidemia Other    Hypertension Mother    Stroke Other    Thyroid disease Brother     Social History:  Social History   Tobacco Use   Smoking status: Former   Smokeless tobacco: Never  Substance Use Topics   Alcohol use: Yes    Comment: Rarely   Drug use: No    ROS: Constitutional:  Negative for fever, chills, weight loss CV: Negative for chest pain, previous MI, hypertension Respiratory:  Negative for shortness of breath, wheezing, sleep apnea, frequent cough GI:  Negative for nausea, vomiting, bloody stool, GERD  Physical exam: BP 132/81   Pulse (!) 55   Ht 5\' 10"  (1.778 m)   Wt 175 lb (79.4 kg)   BMI 25.11 kg/m  GENERAL APPEARANCE:  Well appearing, well developed, well nourished, NAD HEENT:  Atraumatic, normocephalic, oropharynx clear NECK:  Supple without lymphadenopathy or thyromegaly ABDOMEN:  Soft, non-tender, no masses EXTREMITIES:  Moves all extremities well, without clubbing, cyanosis, or edema NEUROLOGIC:  Alert and oriented x 3, normal gait, CN II-XII grossly intact MENTAL STATUS:  appropriate BACK:  Non-tender to palpation, No CVAT SKIN:  Warm, dry, and intact  Results: U/A:  3-10 RBC

## 2023-07-09 LAB — URINALYSIS, ROUTINE W REFLEX MICROSCOPIC
Bilirubin, UA: NEGATIVE
Glucose, UA: NEGATIVE
Ketones, UA: NEGATIVE
Leukocytes,UA: NEGATIVE
Nitrite, UA: NEGATIVE
Protein,UA: NEGATIVE
Specific Gravity, UA: 1.02 (ref 1.005–1.030)
Urobilinogen, Ur: 1 mg/dL (ref 0.2–1.0)
pH, UA: 6 (ref 5.0–7.5)

## 2023-07-09 LAB — MICROSCOPIC EXAMINATION

## 2023-07-11 ENCOUNTER — Telehealth: Payer: Self-pay | Admitting: Urology

## 2023-07-11 NOTE — Telephone Encounter (Signed)
Pt is returning your call

## 2023-07-13 ENCOUNTER — Telehealth: Payer: Self-pay

## 2023-07-13 NOTE — Telephone Encounter (Signed)
-----   Message from Di Kindle sent at 07/05/2023  4:10 PM EDT ----- Please schedule for left ESL.

## 2023-07-13 NOTE — Telephone Encounter (Signed)
Left msg for pt to return call regarding procedure to treat kidney stone.

## 2023-07-17 ENCOUNTER — Ambulatory Visit: Payer: Self-pay | Admitting: Urology

## 2023-07-17 DIAGNOSIS — N200 Calculus of kidney: Secondary | ICD-10-CM

## 2023-07-19 NOTE — Telephone Encounter (Signed)
Left 2nd msg for pt to return call about scheduling procedure.

## 2023-07-19 NOTE — Telephone Encounter (Signed)
Pt stopped by the office to complete the paperwork.

## 2023-07-20 ENCOUNTER — Encounter (HOSPITAL_BASED_OUTPATIENT_CLINIC_OR_DEPARTMENT_OTHER): Payer: Self-pay | Admitting: Urology

## 2023-07-20 NOTE — Progress Notes (Signed)
Spoke w/ via phone for pre-op interview--- Darrell Bridges needs dos----        n/a       COVID test -----patient states asymptomatic no test needed Arrive at ------- 1215 NPO after MN NO Solid Food.  Clear liquids from MN until--- NPO except sips of water with meds Med rec completed. Pt aware to hold ASA/NSAIDs and supplements per [facility] protocol. Last ASA per pt 07/18/23 Medications to take morning of surgery ----- losartan, metoprolol, statin, tylenol prn Diabetic/Weight loss medication ----- n/a No Alcohol or recreational drugs for 24 hours/Tobacco products for 6 hours ---- n/a Patient instructed to bring blue lithotripsy folder, photo id and insurance card day of surgery. Patient aware to have Driver (ride ) / caregiver for 24 hours after surgery ----- Stanton Kidney (spouse) Patient Special Instructions ----- Pre-Op special Instructions ----- take laxative of choice day before procedure Patient verbalized understanding of instructions that were given at this phone interview. Patient denies shortness of breath, chest pain, fever, cough at this phone interview.   Cardiologist - Dr. Ivan Croft; Spoke with Judeth Cornfield at office and requested EKG to be faxed to [facility]

## 2023-07-23 ENCOUNTER — Other Ambulatory Visit: Payer: Self-pay

## 2023-07-23 ENCOUNTER — Ambulatory Visit (HOSPITAL_BASED_OUTPATIENT_CLINIC_OR_DEPARTMENT_OTHER)
Admission: RE | Admit: 2023-07-23 | Discharge: 2023-07-23 | Disposition: A | Discharge status: Home / Self Care | Payer: Medicare HMO | Attending: Urology | Admitting: Urology

## 2023-07-23 ENCOUNTER — Encounter (HOSPITAL_BASED_OUTPATIENT_CLINIC_OR_DEPARTMENT_OTHER): Payer: Self-pay | Admitting: Urology

## 2023-07-23 ENCOUNTER — Encounter (HOSPITAL_BASED_OUTPATIENT_CLINIC_OR_DEPARTMENT_OTHER)
Admission: RE | Disposition: A | Discharge status: Home / Self Care | Payer: Self-pay | Source: Home / Self Care | Attending: Urology

## 2023-07-23 ENCOUNTER — Ambulatory Visit (HOSPITAL_COMMUNITY): Payer: Medicare HMO

## 2023-07-23 DIAGNOSIS — Z87891 Personal history of nicotine dependence: Secondary | ICD-10-CM | POA: Insufficient documentation

## 2023-07-23 DIAGNOSIS — N202 Calculus of kidney with calculus of ureter: Secondary | ICD-10-CM | POA: Insufficient documentation

## 2023-07-23 DIAGNOSIS — N200 Calculus of kidney: Secondary | ICD-10-CM

## 2023-07-23 HISTORY — PX: EXTRACORPOREAL SHOCK WAVE LITHOTRIPSY: SHX1557

## 2023-07-23 SURGERY — EXTRACORPOREAL SHOCK WAVE LITHOTRIPSY (ESWL)
Anesthesia: LOCAL | Laterality: Left

## 2023-07-23 MED ORDER — DIAZEPAM 5 MG PO TABS
ORAL_TABLET | ORAL | Status: AC
  Filled 2023-07-23: qty 2

## 2023-07-23 MED ORDER — DIPHENHYDRAMINE HCL 25 MG PO CAPS
ORAL_CAPSULE | ORAL | Status: AC
  Filled 2023-07-23: qty 1

## 2023-07-23 MED ORDER — TAMSULOSIN HCL 0.4 MG PO CAPS: 0.4000 mg | ORAL_CAPSULE | Freq: Every day | ORAL | 1 refills | Status: DC

## 2023-07-23 MED ORDER — SODIUM CHLORIDE 0.9 % IV SOLN: INTRAVENOUS | Status: DC

## 2023-07-23 MED ORDER — DIAZEPAM 5 MG PO TABS
10.0000 mg | ORAL_TABLET | ORAL | Status: AC
  Administered 2023-07-23: 10 mg via ORAL

## 2023-07-23 MED ORDER — CIPROFLOXACIN HCL 500 MG PO TABS
500.0000 mg | ORAL_TABLET | ORAL | Status: AC
  Administered 2023-07-23: 500 mg via ORAL

## 2023-07-23 MED ORDER — DIPHENHYDRAMINE HCL 25 MG PO CAPS
25.0000 mg | ORAL_CAPSULE | ORAL | Status: AC
  Administered 2023-07-23: 25 mg via ORAL

## 2023-07-23 MED ORDER — HYDROCODONE-ACETAMINOPHEN 5-325 MG PO TABS: 1.0000 | ORAL_TABLET | Freq: Four times a day (QID) | ORAL | 0 refills | Status: DC | PRN

## 2023-07-23 MED ORDER — CIPROFLOXACIN HCL 500 MG PO TABS
ORAL_TABLET | ORAL | Status: AC
  Filled 2023-07-23: qty 1

## 2023-07-23 NOTE — Interval H&P Note (Signed)
History and Physical Interval Note:  07/23/2023 12:40 PM  Darrell Bridges  has presented today for surgery, with the diagnosis of left ureteral stone.  The various methods of treatment have been discussed with the patient and family. After consideration of risks, benefits and other options for treatment, the patient has consented to  Procedure(s): EXTRACORPOREAL SHOCK WAVE LITHOTRIPSY (ESWL) (Left) as a surgical intervention.  The patient's history has been reviewed, patient examined, no change in status, stable for surgery.  I have reviewed the patient's chart and labs.  Questions were answered to the patient's satisfaction.     Di Kindle

## 2023-07-23 NOTE — Discharge Instructions (Signed)

## 2023-07-24 ENCOUNTER — Telehealth: Payer: Self-pay | Admitting: Urology

## 2023-07-24 ENCOUNTER — Encounter (HOSPITAL_BASED_OUTPATIENT_CLINIC_OR_DEPARTMENT_OTHER): Payer: Self-pay | Admitting: Urology

## 2023-07-24 NOTE — Telephone Encounter (Signed)
-----   Message from Di Kindle sent at 07/23/2023  3:02 PM EDT ----- Please schedule post opt appt with KUB in 1 week

## 2023-07-24 NOTE — Telephone Encounter (Signed)
LVM for patient to call back and schedule

## 2023-07-26 ENCOUNTER — Other Ambulatory Visit: Payer: Self-pay

## 2023-07-26 DIAGNOSIS — N200 Calculus of kidney: Secondary | ICD-10-CM

## 2023-07-31 ENCOUNTER — Encounter: Payer: Self-pay | Admitting: Urology

## 2023-07-31 ENCOUNTER — Ambulatory Visit (HOSPITAL_BASED_OUTPATIENT_CLINIC_OR_DEPARTMENT_OTHER)
Admission: RE | Admit: 2023-07-31 | Discharge: 2023-07-31 | Disposition: A | Discharge status: Home / Self Care | Payer: Medicare HMO | Source: Ambulatory Visit | Attending: Urology | Admitting: Urology

## 2023-07-31 ENCOUNTER — Ambulatory Visit (INDEPENDENT_AMBULATORY_CARE_PROVIDER_SITE_OTHER): Payer: Medicare HMO | Admitting: Urology

## 2023-07-31 VITALS — BP 138/83 | HR 64 | Ht 70.0 in | Wt 166.0 lb

## 2023-07-31 DIAGNOSIS — N200 Calculus of kidney: Secondary | ICD-10-CM | POA: Insufficient documentation

## 2023-07-31 DIAGNOSIS — N201 Calculus of ureter: Secondary | ICD-10-CM

## 2023-07-31 DIAGNOSIS — Z09 Encounter for follow-up examination after completed treatment for conditions other than malignant neoplasm: Secondary | ICD-10-CM | POA: Diagnosis not present

## 2023-07-31 DIAGNOSIS — R3129 Other microscopic hematuria: Secondary | ICD-10-CM

## 2023-07-31 LAB — URINALYSIS, ROUTINE W REFLEX MICROSCOPIC
Bilirubin, UA: NEGATIVE
Glucose, UA: NEGATIVE
Ketones, UA: NEGATIVE
Leukocytes,UA: NEGATIVE
Nitrite, UA: NEGATIVE
Protein,UA: NEGATIVE
Specific Gravity, UA: 1.015 (ref 1.005–1.030)
Urobilinogen, Ur: 0.2 mg/dL (ref 0.2–1.0)
pH, UA: 6.5 (ref 5.0–7.5)

## 2023-07-31 LAB — MICROSCOPIC EXAMINATION

## 2023-07-31 NOTE — Progress Notes (Signed)
Assessment: 1. Ureteral calculus; left, proximal, 6 mm   2. Microscopic hematuria   3. Nephrolithiasis     Plan: KUB from today reviewed.  The previously noted calcification in the left ureter is no longer seen.  No obvious calcifications are seen along the expected course of the left ureter. Stone material sent for [employer]. Return to office in 1 month. Will make arrangements for renal ultrasound after his next visit.   Chief Complaint:  Chief Complaint  Patient presents with   Post-op Problem    History of Present Illness:  Darrell Bridges is a 67 y.o. male who is seen for continued evaluation of a left ureteral calculus.  He was evaluated for microscopic hematuria. Urinalysis from 05/23/2023 showed 11-30 RBCs. He had onset of left-sided flank pain approximately 1 day after this urinalysis was done.  He reported his symptoms were similar to a prior stone episode from 20 years ago.  His symptoms resolved but he was not aware of passing a stone.  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  No dysuria or gross hematuria.  No history of UTIs. He has a history of nephrolithiasis 20 years ago, spontaneously passing the stone. He has a remote history of tobacco use, quitting in 1996. No lower urinary tract symptoms other than nocturia x 2. IPSS = 2. PSA from 6/24: 0.28 U/A showed 3-10 RBCs.  CT hematuria protocol from 06/21/2023 showed small nonobstructive bilateral renal calculi, no renal mass or obstruction, diffuse thickening of the urinary bladder, and a 6 mm proximal left ureteral calculus. Cystoscopy from 9/24 showed lateral lobe enlargement of the prostate but no mucosal abnormalities. KUB from 06/27/2023 showed a 6 mm calcification overlying the left renal shadow.  He underwent left ESL on 07/23/23.   He returns today for follow-up.  He has done very well since the procedure.  He has passed a number of stone fragments without difficulty.  No flank  pain.  No dysuria or gross hematuria.  He is on tamsulosin.  He has noted improvement in his urinary symptoms with the medication. IPSS = 2.  Portions of the above documentation were copied from a prior visit for review purposes only.   Past Medical History:  Past Medical History:  Diagnosis Date   CAD (coronary artery disease)    Hyperlipidemia    Hypertension    MI (myocardial infarction) (HCC) 2011   cardiac cath, blockage in peripheral artery, no stent; managed with medication   Nephrolithiasis     Past Surgical History:  Past Surgical History:  Procedure Laterality Date   CARDIAC CATHETERIZATION  2011   COLONOSCOPY     x2, most recent 2019   EXTRACORPOREAL SHOCK WAVE LITHOTRIPSY Left 07/23/2023   Procedure: EXTRACORPOREAL SHOCK WAVE LITHOTRIPSY (ESWL);  Surgeon: Milderd Meager., MD;  Location: [facility];  Service: Urology;  Laterality: Left;    Allergies:  Allergies  Allergen Reactions   Lisinopril     Other reaction(s): Cough    Family History:  Family History  Problem Relation Age of Onset   Heart attack Father        Died at 17 of MI   Diabetes Mother    Hyperlipidemia Other    Hypertension Mother    Stroke Other    Thyroid disease Brother     Social History:  Social History   Tobacco Use   Smoking status: Former    Types: Cigarettes    Start date:  1996    Quit date: 83    Years since quitting: 30.8   Smokeless tobacco: Never  Vaping Use   Vaping status: Never Used  Substance Use Topics   Alcohol use: Yes    Comment: Rarely   Drug use: No    ROS: Constitutional:  Negative for fever, chills, weight loss CV: Negative for chest pain, previous MI, hypertension Respiratory:  Negative for shortness of breath, wheezing, sleep apnea, frequent cough GI:  Negative for nausea, vomiting, bloody stool, GERD  Physical exam: BP 138/83   Pulse 64   Ht 5\' 10"  (1.778 m)   Wt 166 lb (75.3 kg)   BMI 23.82 kg/m  GENERAL  APPEARANCE:  Well appearing, well developed, well nourished, NAD HEENT:  Atraumatic, normocephalic, oropharynx clear NECK:  Supple without lymphadenopathy or thyromegaly ABDOMEN:  Soft, non-tender, no masses EXTREMITIES:  Moves all extremities well, without clubbing, cyanosis, or edema NEUROLOGIC:  Alert and oriented x 3, normal gait, CN II-XII grossly intact MENTAL STATUS:  appropriate BACK:  Non-tender to palpation, No CVAT SKIN:  Warm, dry, and intact  Results: U/A:  3-10 RBC

## 2023-08-03 ENCOUNTER — Encounter: Payer: Self-pay | Admitting: Urology

## 2023-08-04 ENCOUNTER — Encounter: Payer: Self-pay | Admitting: Urology

## 2023-08-06 ENCOUNTER — Other Ambulatory Visit: Payer: Self-pay

## 2023-08-06 DIAGNOSIS — E611 Iron deficiency: Secondary | ICD-10-CM

## 2023-08-06 NOTE — Progress Notes (Signed)
Ordered labs

## 2023-08-07 ENCOUNTER — Encounter: Payer: BC Managed Care – PPO | Admitting: Urology

## 2023-08-07 LAB — CBC WITH DIFFERENTIAL/PLATELET
Basophils Absolute: 0 10*3/uL (ref 0.0–0.2)
Basos: 1 %
EOS (ABSOLUTE): 0.2 10*3/uL (ref 0.0–0.4)
Eos: 6 %
Hematocrit: 38.4 % (ref 37.5–51.0)
Hemoglobin: 12.8 g/dL — ABNORMAL LOW (ref 13.0–17.7)
Immature Grans (Abs): 0 10*3/uL (ref 0.0–0.1)
Immature Granulocytes: 0 %
Lymphocytes Absolute: 0.9 10*3/uL (ref 0.7–3.1)
Lymphs: 27 %
MCH: 33.2 pg — ABNORMAL HIGH (ref 26.6–33.0)
MCHC: 33.3 g/dL (ref 31.5–35.7)
MCV: 100 fL — ABNORMAL HIGH (ref 79–97)
Monocytes Absolute: 0.4 10*3/uL (ref 0.1–0.9)
Monocytes: 11 %
Neutrophils Absolute: 1.8 10*3/uL (ref 1.4–7.0)
Neutrophils: 55 %
Platelets: 134 10*3/uL — ABNORMAL LOW (ref 150–450)
RBC: 3.86 x10E6/uL — ABNORMAL LOW (ref 4.14–5.80)
RDW: 11.1 % — ABNORMAL LOW (ref 11.6–15.4)
WBC: 3.2 10*3/uL — ABNORMAL LOW (ref 3.4–10.8)

## 2023-08-07 LAB — IRON,TIBC AND FERRITIN PANEL
Ferritin: 86 ng/mL (ref 30–400)
Iron Saturation: 36 % (ref 15–55)
Iron: 129 ug/dL (ref 38–169)
Total Iron Binding Capacity: 360 ug/dL (ref 250–450)
UIBC: 231 ug/dL (ref 111–343)

## 2023-08-07 NOTE — Progress Notes (Signed)
Call the lab and see if we can add a B12 and folate level.

## 2023-08-09 LAB — SPECIMEN STATUS REPORT

## 2023-08-09 LAB — B12 AND FOLATE PANEL
Folate: 16.5 ng/mL (ref >3.0)
Vitamin B-12: 644 pg/mL (ref 232–1245)

## 2023-08-09 NOTE — Progress Notes (Signed)
We did add on the B12 which looks great and the folate as well and it looks great as well.  I also encourage you to consider getting your shingles vaccine updated at the pharmacy it is free with Medicare if you get it done at your local pharmacy under your Medicare part B plan.

## 2023-08-20 ENCOUNTER — Encounter: Payer: Self-pay | Admitting: Family Medicine

## 2023-08-20 ENCOUNTER — Ambulatory Visit (INDEPENDENT_AMBULATORY_CARE_PROVIDER_SITE_OTHER): Payer: Medicare HMO | Admitting: Family Medicine

## 2023-08-20 VITALS — BP 130/84 | HR 63 | Resp 14 | Ht 70.0 in | Wt 176.0 lb

## 2023-08-20 DIAGNOSIS — E785 Hyperlipidemia, unspecified: Secondary | ICD-10-CM

## 2023-08-20 DIAGNOSIS — I2510 Atherosclerotic heart disease of native coronary artery without angina pectoris: Secondary | ICD-10-CM

## 2023-08-20 DIAGNOSIS — Z87442 Personal history of urinary calculi: Secondary | ICD-10-CM

## 2023-08-20 DIAGNOSIS — I10 Essential (primary) hypertension: Secondary | ICD-10-CM

## 2023-08-20 DIAGNOSIS — E7800 Pure hypercholesterolemia, unspecified: Secondary | ICD-10-CM | POA: Diagnosis not present

## 2023-08-20 DIAGNOSIS — R3129 Other microscopic hematuria: Secondary | ICD-10-CM

## 2023-08-20 MED ORDER — ATORVASTATIN CALCIUM 20 MG PO TABS: 20.0000 mg | ORAL_TABLET | Freq: Every day | ORAL | Status: DC

## 2023-08-20 NOTE — Assessment & Plan Note (Signed)
Prescription to 20 mg he still has plenty of the 90s any splitting them so he does not need a new physical prescription sent but and then update his chart so that when it does need renewal we can send in the 20 mg dose.

## 2023-08-20 NOTE — Progress Notes (Signed)
Established Patient Office Visit  Subjective   Patient ID: Darrell Bridges, male    DOB: 1956/07/05  Age: 67 y.o. MRN: [id number]  Chief Complaint  Patient presents with   Hypertension    HPI  Hypertension- Pt denies chest pain, SOB, dizziness, or heart palpitations.  Taking meds as directed w/o problems.  Denies medication side effects.    He is now lon lower dose of atorvastatin.  He is actually been splitting the dose for quite some time and his cardiologist is aware and agrees with him just taking 20 mg.  He still with the medication otherwise.  He did go to the urologist for kidney stone in the left ureter.  He had shockwave therapy treatment and did pass pieces of the stone.  He is actually doing much better.  Did get a stone analysis and it was consistent with calcium oxalate crystal.  He did for his appointment last week they had to call and cancel he has not had a chance to call back and reschedule yet he was post to have a repeat urine to make sure that the blood in the urine had cleared.     ROS    Objective:     BP 130/84 (BP Location: Left Arm, Patient Position: Sitting)   Pulse 63   Resp 14   Ht 5\' 10"  (1.778 m)   Wt 176 lb (79.8 kg)   SpO2 98%   BMI 25.25 kg/m    Physical Exam Vitals and nursing note reviewed.  Constitutional:      Appearance: Normal appearance.  HENT:     Head: Normocephalic and atraumatic.  Eyes:     Conjunctiva/sclera: Conjunctivae normal.  Cardiovascular:     Rate and Rhythm: Normal rate and regular rhythm.  Pulmonary:     Effort: Pulmonary effort is normal.     Breath sounds: Normal breath sounds.  Skin:    General: Skin is warm and dry.  Neurological:     Mental Status: He is alert.  Psychiatric:        Mood and Affect: Mood normal.      No results found for any visits on 08/20/23.    The ASCVD Risk score (Arnett DK, et al., 2019) failed to calculate for the following reasons:   The patient has a prior MI or stroke  diagnosis    Assessment & Plan:   Problem List Items Addressed This Visit       Cardiovascular and Mediastinum   Hypertension - Primary    Well controlled. Continue current regimen. Follow up in  41mo       Relevant Medications   atorvastatin (LIPITOR) 20 MG tablet   Other Relevant Orders   CMP14+EGFR   Urinalysis, Routine w reflex microscopic   Coronary artery disease   Relevant Medications   atorvastatin (LIPITOR) 20 MG tablet     Other   Pure hypercholesterolemia    Prescription to 20 mg he still has plenty of the 90s any splitting them so he does not need a new physical prescription sent but and then update his chart so that when it does need renewal we can send in the 20 mg dose.      Relevant Medications   atorvastatin (LIPITOR) 20 MG tablet   Hyperlipidemia LDL goal <70   Relevant Medications   atorvastatin (LIPITOR) 20 MG tablet   History of nephrolithiasis   Other Visit Diagnoses     Hematuria, microscopic  Relevant Orders   CMP14+EGFR   Urinalysis, Routine w reflex microscopic      Repeat urinalysis today to make sure that the hematuria has cleared up.  If it is abnormal we can always forward it to his urologist.  Encouraged him to think about getting the Prevnar 20  Return in about 4 months (around 12/18/2023) for Welcome to Medicare Exam  .    Nani Gasser, MD

## 2023-08-20 NOTE — Assessment & Plan Note (Signed)
Well controlled. Continue current regimen. Follow up in  6 mo

## 2023-08-21 LAB — MICROSCOPIC EXAMINATION
Bacteria, UA: NONE SEEN
Casts: NONE SEEN /[LPF]
Epithelial Cells (non renal): NONE SEEN /[HPF] (ref 0–10)
RBC, Urine: NONE SEEN /[HPF] (ref 0–2)
WBC, UA: NONE SEEN /[HPF] (ref 0–5)

## 2023-08-21 LAB — URINALYSIS, ROUTINE W REFLEX MICROSCOPIC
Bilirubin, UA: NEGATIVE
Glucose, UA: NEGATIVE
Ketones, UA: NEGATIVE
Leukocytes,UA: NEGATIVE
Nitrite, UA: NEGATIVE
Protein,UA: NEGATIVE
Specific Gravity, UA: 1.016 (ref 1.005–1.030)
Urobilinogen, Ur: 0.2 mg/dL (ref 0.2–1.0)
pH, UA: 6.5 (ref 5.0–7.5)

## 2023-08-21 LAB — CMP14+EGFR
ALT: 21 [IU]/L (ref 0–44)
AST: 23 [IU]/L (ref 0–40)
Albumin: 4.2 g/dL (ref 3.9–4.9)
Alkaline Phosphatase: 73 [IU]/L (ref 44–121)
BUN/Creatinine Ratio: 21 (ref 10–24)
BUN: 20 mg/dL (ref 8–27)
Bilirubin Total: 0.4 mg/dL (ref 0.0–1.2)
CO2: 23 mmol/L (ref 20–29)
Calcium: 9.2 mg/dL (ref 8.6–10.2)
Chloride: 101 mmol/L (ref 96–106)
Creatinine, Ser: 0.94 mg/dL (ref 0.76–1.27)
Globulin, Total: 2.2 g/dL (ref 1.5–4.5)
Glucose: 114 mg/dL — ABNORMAL HIGH (ref 70–99)
Potassium: 4.3 mmol/L (ref 3.5–5.2)
Sodium: 139 mmol/L (ref 134–144)
Total Protein: 6.4 g/dL (ref 6.0–8.5)
eGFR: 90 mL/min/{1.73_m2} (ref >59)

## 2023-08-21 NOTE — Progress Notes (Signed)
Hi Darrell Bridges, metabolic panel looks okay urine sample should just showed 3-10 red blood cells which is what it was a month ago we will be happy to look forward this to your urologist to keep them in the loop.

## 2023-08-23 ENCOUNTER — Other Ambulatory Visit: Payer: Self-pay | Admitting: Sports Medicine

## 2023-08-23 DIAGNOSIS — I10 Essential (primary) hypertension: Secondary | ICD-10-CM

## 2023-08-23 DIAGNOSIS — E785 Hyperlipidemia, unspecified: Secondary | ICD-10-CM

## 2023-08-23 DIAGNOSIS — I2510 Atherosclerotic heart disease of native coronary artery without angina pectoris: Secondary | ICD-10-CM

## 2023-08-24 NOTE — Telephone Encounter (Signed)
To PCP

## 2023-08-27 ENCOUNTER — Ambulatory Visit: Payer: Medicare HMO | Admitting: Urology

## 2023-08-31 MED ORDER — ATORVASTATIN CALCIUM 20 MG PO TABS: 20.0000 mg | ORAL_TABLET | Freq: Every day | ORAL | 1 refills | Status: DC

## 2023-08-31 NOTE — Telephone Encounter (Signed)
Copied from CRM [id number]. Topic: Clinical - Medication Refill >> Aug 31, 2023  9:01 AM [name] C wrote: Most Recent Primary Care Visit:  Provider: Nani Gasser D  Department: [facility]  Visit Type: OFFICE VISIT  Date: 08/20/2023  Medication: atorvastatin (LIPITOR)   Has the patient contacted their pharmacy? Yes, [name] is calling to have rx refill, pt did not provide any other information. (Agent: If no, request that the patient contact the pharmacy for the refill. If patient does not wish to contact the pharmacy document the reason why and proceed with request.) (Agent: If yes, when and what did the pharmacy advise?)  Is this the correct pharmacy for this prescription? No [facility] Ph: [phone] Fax: [phone]   Has the prescription been filled recently?   Is the patient out of the medication?   Has the patient been seen for an appointment in the last year OR does the patient have an upcoming appointment?   Can we respond through MyChart?   Agent: Please be advised that Rx refills may take up to 3 business days. We ask that you follow-up with your pharmacy.

## 2023-08-31 NOTE — Telephone Encounter (Signed)
Task completed. Rx refill sent to mail order pharmacy.

## 2023-08-31 NOTE — Addendum Note (Signed)
Addended by: Delfino Lovett on: 08/31/2023 04:43 PM   Modules accepted: Orders

## 2023-09-04 ENCOUNTER — Encounter: Payer: Self-pay | Admitting: Urology

## 2023-09-04 ENCOUNTER — Ambulatory Visit: Payer: Medicare HMO | Admitting: Urology

## 2023-09-04 VITALS — BP 141/88 | HR 61 | Ht 70.0 in | Wt 173.0 lb

## 2023-09-04 DIAGNOSIS — N201 Calculus of ureter: Secondary | ICD-10-CM

## 2023-09-04 DIAGNOSIS — R3129 Other microscopic hematuria: Secondary | ICD-10-CM

## 2023-09-04 DIAGNOSIS — N200 Calculus of kidney: Secondary | ICD-10-CM

## 2023-09-04 NOTE — Progress Notes (Signed)
Assessment: 1. Ureteral calculus; left, proximal, 6 mm   2. Microscopic hematuria   3. Nephrolithiasis     Plan: Stone prevention discussed and information provided. Schedule for renal ultrasound in the next month.  Will call with results. Return to office as needed.  Chief Complaint:  Chief Complaint  Patient presents with   Nephrolithiasis    History of Present Illness:  Darrell Bridges is a 67 y.o. male who is seen for continued evaluation of a left ureteral calculus.  He was evaluated for microscopic hematuria. Urinalysis from 05/23/2023 showed 11-30 RBCs. He had onset of left-sided flank pain approximately 1 day after this urinalysis was done.  He reported his symptoms were similar to a prior stone episode from 20 years ago.  His symptoms resolved but he was not aware of passing a stone.  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  No dysuria or gross hematuria.  No history of UTIs. He has a history of nephrolithiasis 20 years ago, spontaneously passing the stone. He has a remote history of tobacco use, quitting in 1996. No lower urinary tract symptoms other than nocturia x 2. IPSS = 2. PSA from 6/24: 0.28 U/A showed 3-10 RBCs.  CT hematuria protocol from 06/21/2023 showed small nonobstructive bilateral renal calculi, no renal mass or obstruction, diffuse thickening of the urinary bladder, and a 6 mm proximal left ureteral calculus. Cystoscopy from 9/24 showed lateral lobe enlargement of the prostate but no mucosal abnormalities. KUB from 06/27/2023 showed a 6 mm calcification overlying the left renal shadow.  He underwent left ESL on 07/23/23.   He did very well following the procedure.  He passed a number of stone fragments without difficulty.  No flank pain.  No dysuria or gross hematuria.  He continued on tamsulosin.  He has noted improvement in his urinary symptoms with the medication. IPSS = 2. KUB from 07/31/2023 showed no obvious  calcifications in the area of the left ureter and an unchanged calcification in the right renal shadow. Stone analysis: 97% calcium oxalate monohydrate, 2% calcium oxalate dihydrate, 1% calcium phosphate carbonate.  He returns today for follow-up.  No flank pain.  No gross hematuria.  He has not passed any additional stone fragments.  Portions of the above documentation were copied from a prior visit for review purposes only.   Past Medical History:  Past Medical History:  Diagnosis Date   CAD (coronary artery disease)    Hyperlipidemia    Hypertension    MI (myocardial infarction) (HCC) 2011   cardiac cath, blockage in peripheral artery, no stent; managed with medication   Nephrolithiasis     Past Surgical History:  Past Surgical History:  Procedure Laterality Date   CARDIAC CATHETERIZATION  2011   COLONOSCOPY     x2, most recent 2019   EXTRACORPOREAL SHOCK WAVE LITHOTRIPSY Left 07/23/2023   Procedure: EXTRACORPOREAL SHOCK WAVE LITHOTRIPSY (ESWL);  Surgeon: Milderd Meager., MD;  Location: [facility];  Service: Urology;  Laterality: Left;    Allergies:  Allergies  Allergen Reactions   Lisinopril     Other reaction(s): Cough    Family History:  Family History  Problem Relation Age of Onset   Heart attack Father        Died at 41 of MI   Diabetes Mother    Hyperlipidemia Other    Hypertension Mother    Stroke Other    Thyroid disease Brother     Social History:  Social History   Tobacco Use   Smoking status: Former    Types: Cigarettes    Start date: 1996    Quit date: 1994    Years since quitting: 30.9   Smokeless tobacco: Never  Vaping Use   Vaping status: Never Used  Substance Use Topics   Alcohol use: Yes    Comment: Rarely   Drug use: No    ROS: Constitutional:  Negative for fever, chills, weight loss CV: Negative for chest pain, previous MI, hypertension Respiratory:  Negative for shortness of breath, wheezing, sleep  apnea, frequent cough GI:  Negative for nausea, vomiting, bloody stool, GERD  Physical exam: BP (!) 141/88   Pulse 61   Ht 5\' 10"  (1.778 m)   Wt 173 lb (78.5 kg)   BMI 24.82 kg/m  GENERAL APPEARANCE:  Well appearing, well developed, well nourished, NAD HEENT:  Atraumatic, normocephalic, oropharynx clear NECK:  Supple without lymphadenopathy or thyromegaly ABDOMEN:  Soft, non-tender, no masses EXTREMITIES:  Moves all extremities well, without clubbing, cyanosis, or edema NEUROLOGIC:  Alert and oriented x 3, normal gait, CN II-XII grossly intact MENTAL STATUS:  appropriate BACK:  Non-tender to palpation, No CVAT SKIN:  Warm, dry, and intact  Results: U/A: 0-2 RBC

## 2023-09-05 LAB — URINALYSIS, ROUTINE W REFLEX MICROSCOPIC
Bilirubin, UA: NEGATIVE
Glucose, UA: NEGATIVE
Ketones, UA: NEGATIVE
Leukocytes,UA: NEGATIVE
Nitrite, UA: NEGATIVE
Protein,UA: NEGATIVE
Specific Gravity, UA: 1.01 (ref 1.005–1.030)
Urobilinogen, Ur: 0.2 mg/dL (ref 0.2–1.0)
pH, UA: 6.5 (ref 5.0–7.5)

## 2023-09-05 LAB — MICROSCOPIC EXAMINATION

## 2023-09-12 ENCOUNTER — Telehealth (HOSPITAL_BASED_OUTPATIENT_CLINIC_OR_DEPARTMENT_OTHER): Payer: Self-pay | Admitting: Urology

## 2023-10-05 ENCOUNTER — Ambulatory Visit (HOSPITAL_BASED_OUTPATIENT_CLINIC_OR_DEPARTMENT_OTHER)
Admission: RE | Admit: 2023-10-05 | Discharge: 2023-10-05 | Disposition: A | Discharge status: Home / Self Care | Payer: Medicare HMO | Source: Ambulatory Visit | Attending: Urology | Admitting: Urology

## 2023-10-05 DIAGNOSIS — N201 Calculus of ureter: Secondary | ICD-10-CM | POA: Insufficient documentation

## 2023-10-08 ENCOUNTER — Encounter: Payer: Self-pay | Admitting: Urology

## 2023-12-07 ENCOUNTER — Other Ambulatory Visit: Payer: Self-pay | Admitting: Sports Medicine

## 2023-12-07 DIAGNOSIS — I10 Essential (primary) hypertension: Secondary | ICD-10-CM

## 2023-12-18 ENCOUNTER — Ambulatory Visit (INDEPENDENT_AMBULATORY_CARE_PROVIDER_SITE_OTHER): Payer: Medicare HMO | Admitting: Family Medicine

## 2023-12-18 ENCOUNTER — Encounter: Payer: Self-pay | Admitting: Family Medicine

## 2023-12-18 VITALS — BP 125/89 | HR 52 | Ht 70.0 in | Wt 174.0 lb

## 2023-12-18 DIAGNOSIS — Z0000 Encounter for general adult medical examination without abnormal findings: Secondary | ICD-10-CM

## 2023-12-18 DIAGNOSIS — Z23 Encounter for immunization: Secondary | ICD-10-CM | POA: Diagnosis not present

## 2023-12-18 NOTE — Progress Notes (Signed)
 Subjective:    Darrell Bridges is a 68 y.o. male who presents for a Welcome to Medicare exam.         Objective:    Today's Vitals   12/18/23 1526  BP: 125/89  Pulse: (!) 52  SpO2: 100%  Weight: 174 lb (78.9 kg)  Height: 5\' 10"  (1.778 m)   Body mass index is 24.97 kg/m.  Medications Outpatient Encounter Medications as of 12/18/2023  Medication Sig   aspirin EC 81 MG tablet Take 81 mg by mouth every Monday, Wednesday, and Friday at 6 PM.   atorvastatin (LIPITOR) 20 MG tablet Take 1 tablet (20 mg total) by mouth at bedtime.   Krill Oil 500 MG CAPS Take by mouth.   losartan (COZAAR) 25 MG tablet TAKE 1 TABLET (25 MG TOTAL) BY MOUTH DAILY.   magnesium gluconate (MAGONATE) 500 MG tablet Take 500 mg by mouth daily.   metoprolol succinate (TOPROL-XL) 25 MG 24 hr tablet TAKE 1 TABLET (25 MG TOTAL) BY MOUTH DAILY.   Multiple Vitamin (ONE-A-DAY MENS PO) Take 1 capsule by mouth daily.   Multiple Vitamins-Minerals (ZINC PO) Take 1 capsule by mouth daily.   nitroGLYCERIN (NITROSTAT) 0.4 MG SL tablet Place 1 tablet (0.4 mg total) under the tongue every 5 (five) minutes as needed for chest pain.   No facility-administered encounter medications on file as of 12/18/2023.       History: Past Medical History:  Diagnosis Date   CAD (coronary artery disease)    Hyperlipidemia    Hypertension    MI (myocardial infarction) (HCC) 2011   cardiac cath, blockage in peripheral artery, no stent; managed with medication   Nephrolithiasis    Past Surgical History:  Procedure Laterality Date   CARDIAC CATHETERIZATION  2011   COLONOSCOPY     x2, most recent 2019   EXTRACORPOREAL SHOCK WAVE LITHOTRIPSY Left 07/23/2023   Procedure: EXTRACORPOREAL SHOCK WAVE LITHOTRIPSY (ESWL);  Surgeon: Milderd Meager., MD;  Location: [facility];  Service: Urology;  Laterality: Left;    Family History  Problem Relation Age of Onset   Heart attack Father        Died at 85 of MI    Diabetes Mother    Hyperlipidemia Other    Hypertension Mother    Stroke Other    Thyroid disease Brother    Social History   Occupational History    Employer: [employer]  Tobacco Use   Smoking status: Former    Types: Cigarettes    Start date: 1996    Quit date: 1994    Years since quitting: 31.2   Smokeless tobacco: Never  Vaping Use   Vaping status: Never Used  Substance and Sexual Activity   Alcohol use: Yes    Comment: Rarely   Drug use: No   Sexual activity: Yes    Tobacco Counseling Counseling given: Not Answered   Immunizations and Health Maintenance Immunization History  Administered Date(s) Administered   Fluad Trivalent(High Dose 65+) 05/22/2023   Influenza,inj,Quad PF,6+ Mos 06/20/2019, 06/29/2020, 07/05/2021, 08/08/2022   PNEUMOCOCCAL CONJUGATE-20 12/18/2023   PPD Test 06/06/2022   Tdap 08/04/2011, 07/05/2021   There are no preventive care reminders to display for this patient.   Activities of Daily Living    12/15/2023    2:00 PM 07/23/2023   12:20 PM  In your present state of health, do you have any difficulty performing the following activities:  Hearing? 0 0  Vision? 0 0  Difficulty  concentrating or making decisions? 0 0  Walking or climbing stairs? 0   Dressing or bathing? 0   Doing errands, shopping? 0   Preparing Food and eating ? N   Using the Toilet? N   In the past six months, have you accidently leaked urine? N   Do you have problems with loss of bowel control? N   Managing your Medications? N   Managing your Finances? N   Housekeeping or managing your Housekeeping? N     Physical Exam   Physical Exam (optional), or other factors deemed appropriate based on the beneficiary's medical and social history and current clinical standards.   Advanced Directives:     EKG:   tracings, sinus bradycardia  EKG shows rate of 58 bpm, normal sinus bradycardic rhythm.  No acute ST-T wave changes.     Assessment:    This is a  routine wellness  examination for this patient .   Vision/Hearing screen Vision Screening   Right eye Left eye Both eyes  Without correction 20/25 20/25 20/25   With correction 20/20 20/25 20/20      Goals   None      Depression Screen    12/18/2023    3:27 PM 08/20/2023    2:26 PM 01/03/2022    3:47 PM 12/30/2020    4:05 PM  PHQ 2/9 Scores  PHQ - 2 Score 0 0 0 0     Fall Risk    12/18/2023    3:27 PM  Fall Risk   Falls in the past year? 0  Number falls in past yr: 0  Injury with Fall? 0  Risk for fall due to : No Fall Risks  Follow up Falls evaluation completed    Cognitive Function        12/18/2023    3:28 PM  6CIT Screen  What Year? 0 points  What month? 0 points  What time? 0 points  Count back from 20 0 points  Months in reverse 0 points  Repeat phrase 0 points  Total Score 0 points    Patient Care Team: Agapito Games, MD as PCP - General Leeann Must Tiffany Kocher, MD as Attending Physician (Cardiology)     Plan:     I have personally reviewed and noted the following in the patient's chart:   Medical and social history Use of alcohol, tobacco or illicit drugs  Current medications and supplements Functional ability and status Nutritional status Physical activity Advanced directives List of other physicians Hospitalizations, surgeries, and ER visits in previous 12 months Vitals Screenings to include cognitive, depression, and falls Referrals and appointments  In addition, I have reviewed and discussed with patient certain preventive protocols, quality metrics, and best practice recommendations. A written personalized care plan for preventive services as well as general preventive health recommendations were provided to patient.     Nani Gasser, MD 12/18/2023

## 2023-12-18 NOTE — Patient Instructions (Addendum)
  Mr. Strege , Thank you for taking time to come for your Medicare Wellness Visit. I appreciate your ongoing commitment to your health goals. Please review the following plan we discussed and let me know if I can assist you in the future.   These are the goals we discussed:  Goals      Exercise 150 min/wk Moderate Activity        This is a list of the screening recommended for you and due dates:  Health Maintenance  Topic Date Due   COVID-19 Vaccine (1 - 2024-25 season) 09/04/2024*   Zoster (Shingles) Vaccine (1 of 2) 09/18/2024*   Medicare Annual Wellness Visit  12/17/2024   Colon Cancer Screening  12/01/2027   DTaP/Tdap/Td vaccine (3 - Td or Tdap) 07/06/2031   Pneumonia Vaccine  Completed   Flu Shot  Completed   Hepatitis C Screening  Completed   HIV Screening  Completed   HPV Vaccine  Aged Out  *Topic was postponed. The date shown is not the original due date.

## 2023-12-22 ENCOUNTER — Encounter: Payer: Self-pay | Admitting: Family Medicine

## 2023-12-22 DIAGNOSIS — E7800 Pure hypercholesterolemia, unspecified: Secondary | ICD-10-CM

## 2023-12-22 DIAGNOSIS — I10 Essential (primary) hypertension: Secondary | ICD-10-CM

## 2024-01-18 ENCOUNTER — Other Ambulatory Visit: Payer: Self-pay | Admitting: Family Medicine

## 2024-01-18 DIAGNOSIS — I10 Essential (primary) hypertension: Secondary | ICD-10-CM

## 2024-01-19 LAB — LIPID PANEL
Chol/HDL Ratio: 2.6 ratio (ref 0.0–5.0)
Cholesterol, Total: 146 mg/dL (ref 100–199)
HDL: 56 mg/dL (ref >39)
LDL Chol Calc (NIH): 76 mg/dL (ref 0–99)
Triglycerides: 70 mg/dL (ref 0–149)
VLDL Cholesterol Cal: 14 mg/dL (ref 5–40)

## 2024-01-19 LAB — CMP14+EGFR
ALT: 22 IU/L (ref 0–44)
AST: 24 IU/L (ref 0–40)
Albumin: 4.4 g/dL (ref 3.9–4.9)
Alkaline Phosphatase: 67 IU/L (ref 44–121)
BUN/Creatinine Ratio: 14 (ref 10–24)
BUN: 15 mg/dL (ref 8–27)
Bilirubin Total: 0.7 mg/dL (ref 0.0–1.2)
CO2: 25 mmol/L (ref 20–29)
Calcium: 9.5 mg/dL (ref 8.6–10.2)
Chloride: 102 mmol/L (ref 96–106)
Creatinine, Ser: 1.04 mg/dL (ref 0.76–1.27)
Globulin, Total: 2 g/dL (ref 1.5–4.5)
Glucose: 82 mg/dL (ref 70–99)
Potassium: 4.1 mmol/L (ref 3.5–5.2)
Sodium: 141 mmol/L (ref 134–144)
Total Protein: 6.4 g/dL (ref 6.0–8.5)
eGFR: 80 mL/min/{1.73_m2} (ref >59)

## 2024-01-19 LAB — CBC
Hematocrit: 39 % (ref 37.5–51.0)
Hemoglobin: 13.4 g/dL (ref 13.0–17.7)
MCH: 33 pg (ref 26.6–33.0)
MCHC: 34.4 g/dL (ref 31.5–35.7)
MCV: 96 fL (ref 79–97)
Platelets: 131 10*3/uL — ABNORMAL LOW (ref 150–450)
RBC: 4.06 x10E6/uL — ABNORMAL LOW (ref 4.14–5.80)
RDW: 12.1 % (ref 11.6–15.4)
WBC: 4.4 10*3/uL (ref 3.4–10.8)

## 2024-01-21 ENCOUNTER — Encounter: Payer: Self-pay | Admitting: Family Medicine

## 2024-01-21 NOTE — Progress Notes (Signed)
 Platelets  are still a little bit low, but stable.  Metabolic panel looks great.  LDL cholesterol is right at 76.  He may want you under 70 but him sure he will discuss with you.

## 2024-05-27 ENCOUNTER — Encounter: Payer: Self-pay | Admitting: Sports Medicine

## 2024-06-19 ENCOUNTER — Encounter: Payer: Self-pay | Admitting: Family Medicine

## 2024-06-19 ENCOUNTER — Ambulatory Visit (INDEPENDENT_AMBULATORY_CARE_PROVIDER_SITE_OTHER): Admitting: Family Medicine

## 2024-06-19 VITALS — BP 129/69 | HR 61 | Ht 70.0 in | Wt 178.0 lb

## 2024-06-19 DIAGNOSIS — Z23 Encounter for immunization: Secondary | ICD-10-CM | POA: Diagnosis not present

## 2024-06-19 DIAGNOSIS — I2510 Atherosclerotic heart disease of native coronary artery without angina pectoris: Secondary | ICD-10-CM

## 2024-06-19 DIAGNOSIS — E785 Hyperlipidemia, unspecified: Secondary | ICD-10-CM

## 2024-06-19 DIAGNOSIS — I10 Essential (primary) hypertension: Secondary | ICD-10-CM | POA: Diagnosis not present

## 2024-06-19 DIAGNOSIS — I252 Old myocardial infarction: Secondary | ICD-10-CM | POA: Diagnosis not present

## 2024-06-19 MED ORDER — ATORVASTATIN CALCIUM 20 MG PO TABS: 20.0000 mg | ORAL_TABLET | Freq: Every day | ORAL | 3 refills | Status: DC

## 2024-06-19 NOTE — Progress Notes (Signed)
 Established Patient Office Visit  Subjective  Patient ID: Darrell Bridges, male    DOB: 1955/11/01  Age: 68 y.o. MRN: [id number]  Chief Complaint  Patient presents with   Hypertension    HPI  Discussed the use of AI scribe software for clinical note transcription with the patient, who gave verbal consent to proceed.  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   Nephrolithiasis - History of kidney stones - Comprehensive workup including cystoscopy, X-rays, and MRI showed no masses or tumors - No current flank pain or dysuria - Maintains hydration, especially during summer months while working in heat  Renal function - Serum creatinine levels stable: 1.0 in April 2025 and 0.9 in November 2024  Cardiopulmonary symptoms - No chest pain - No shortness of breath - was on atorvastatin  40mg  but was having memory problems so went back to 20mg  dose.    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  Constitutional:      Appearance: Normal appearance.  HENT:     Head: Normocephalic and atraumatic.  Eyes:     Conjunctiva/sclera: Conjunctivae normal.  Neck:     Vascular: No carotid bruit.   Cardiovascular:     Rate and Rhythm: Normal rate and regular rhythm.  Pulmonary:     Effort: Pulmonary effort is normal.     Breath sounds: Normal breath sounds.  Skin:    General: Skin is warm and dry.  Neurological:     Mental Status: He is alert.  Psychiatric:        Mood and Affect: Mood normal.      No results found for any visits on 06/19/24.    The ASCVD Risk score (Arnett DK, et al., 2019) failed to calculate for the following reasons:   Risk score cannot be calculated because patient has a medical history suggesting prior/existing ASCVD    Assessment & Plan:   Problem List Items Addressed This Visit       Cardiovascular and Mediastinum   Hypertension   Well controlled. Continue current regimen. Follow up in  40mo.  Due for updated 72-month CMP.  Continue losartan  25 mg.      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  Encouraged him to give that a trial for 3 to 4 weeks and then see if we can recheck his direct LDL at that time and metabolic panel.      Relevant Medications   atorvastatin  (LIPITOR) 20 MG tablet   History of MI (myocardial infarction)   Restart ASA.       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 Previous evaluations negative for masses or tumors.  Aspirin use discussed as a potential factor. No gross hematuria or significant renal stress. Serum creatinine stable. - Resume aspirin intake three days a week (Monday, Wednesday, Friday). - Monitor for signs of gross hematuria and report if observed. - Continue renal function monitoring every six months.     Return in about 6 months (around 12/17/2024) for Hypertension.    Dorothyann Byars, MD

## 2024-06-19 NOTE — Assessment & Plan Note (Signed)
 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  Encouraged him to give that a trial for 3 to 4 weeks and then see if we can recheck his direct LDL at that time and metabolic panel.

## 2024-06-19 NOTE — Assessment & Plan Note (Addendum)
 Well controlled. Continue current regimen. Follow up in  82mo.  Due for updated 87-month CMP.  Continue losartan  25 mg.

## 2024-06-19 NOTE — Assessment & Plan Note (Signed)
Restart ASA.

## 2024-06-19 NOTE — Progress Notes (Signed)
 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 He was advised by his urologist that is is not anything to be concerned about unless this was a gross/large amount. However, he would like to discuss with Dr. Alvan

## 2024-07-24 ENCOUNTER — Ambulatory Visit: Payer: Self-pay | Admitting: Family Medicine

## 2024-07-24 LAB — CMP14+EGFR
ALT: 17 IU/L (ref 0–44)
AST: 21 IU/L (ref 0–40)
Albumin: 4.5 g/dL (ref 3.9–4.9)
Alkaline Phosphatase: 57 IU/L (ref 47–123)
BUN/Creatinine Ratio: 12 (ref 10–24)
BUN: 14 mg/dL (ref 8–27)
Bilirubin Total: 0.7 mg/dL (ref 0.0–1.2)
CO2: 24 mmol/L (ref 20–29)
Calcium: 9.7 mg/dL (ref 8.6–10.2)
Chloride: 100 mmol/L (ref 96–106)
Creatinine, Ser: 1.13 mg/dL (ref 0.76–1.27)
Globulin, Total: 2.3 g/dL (ref 1.5–4.5)
Glucose: 96 mg/dL (ref 70–99)
Potassium: 3.9 mmol/L (ref 3.5–5.2)
Sodium: 139 mmol/L (ref 134–144)
Total Protein: 6.8 g/dL (ref 6.0–8.5)
eGFR: 72 mL/min/1.73 (ref >59)

## 2024-07-24 LAB — LDL CHOLESTEROL, DIRECT: LDL Direct: 84 mg/dL (ref 0–99)

## 2024-07-24 NOTE — Progress Notes (Signed)
 Hi Darrell Bridges, metabolic panel looks great.  LDL cholesterol is around 84 which is not bad number but ideally needs to be under 70.  How would you feel about increasing the atorvastatin  to 40?

## 2024-07-25 ENCOUNTER — Other Ambulatory Visit: Payer: Self-pay | Admitting: Family Medicine

## 2024-07-25 MED ORDER — ATORVASTATIN CALCIUM 40 MG PO TABS: 40.0000 mg | ORAL_TABLET | Freq: Every day | ORAL | 3 refills | Status: AC

## 2024-10-20 ENCOUNTER — Encounter: Payer: Self-pay | Admitting: Family Medicine

## 2024-10-20 ENCOUNTER — Telehealth (INDEPENDENT_AMBULATORY_CARE_PROVIDER_SITE_OTHER): Admitting: Family Medicine

## 2024-10-20 DIAGNOSIS — I10 Essential (primary) hypertension: Secondary | ICD-10-CM

## 2024-10-20 DIAGNOSIS — F4320 Adjustment disorder, unspecified: Secondary | ICD-10-CM | POA: Diagnosis not present

## 2024-10-20 DIAGNOSIS — M542 Cervicalgia: Secondary | ICD-10-CM | POA: Diagnosis not present

## 2024-10-20 MED ORDER — LOSARTAN POTASSIUM 50 MG PO TABS: 50.0000 mg | ORAL_TABLET | Freq: Every day | ORAL | 1 refills | Status: AC

## 2024-10-20 NOTE — Progress Notes (Signed)
 "                    MyChart Video Visit    Virtual Visit via Video Note   This format is felt to be most appropriate for this patient at this time. Physical exam was limited by quality of the video and audio technology used for the visit.    Patient location: home Provider location: [facility] - home b/o of weather  Persons involved in the visit: patient, provider  I discussed the limitations of evaluation and management by telemedicine and the availability of in person appointments. The patient expressed understanding and agreed to proceed.  Patient: Darrell Bridges   DOB: 30-Sep-1955   69 y.o. Male  MRN: [id number] Visit Date: 10/20/2024  Today's healthcare provider: Dorothyann Byars, MD   No chief complaint on file.   Subjective:    HPI  Discussed the use of AI scribe software for clinical note transcription with the patient, who gave verbal consent to proceed.  History of Present Illness Darrell Bridges is a 69 year old male who presents with neck pain and elevated blood pressure.  Cervical pain - Pain localized to the left side of the neck, behind the ear, present for three weeks - Pain is persistent but not present today - No history of trauma or inciting event - Partial relief with Tylenol  - No associated ear symptoms such as clogging or hearing loss  Hypertension - Elevated blood pressure readings over the past three weeks - Highest recorded blood pressure was 161/93 mmHg - Most readings above 130/80 mmHg - Current blood pressure is 130/69 mmHg - On lowest dose of losartan , 25 mg  - No changes in medication appearance or dosage  Sleep disturbance - Poor sleep quality over the past three weeks - Only one night of eight hours of sleep during this period - Attributes sleep issues to stress and recent personal events, including the passing of his wife  Caffeine use - No increase in caffeine intake -  Consuming two cups of coffee in the morning Last labs in Oct, normal renal function.   ROS       Objective:    There were no vitals taken for this visit.      Physical Exam Vitals reviewed.  Constitutional:      Appearance: Normal appearance.  HENT:     Head: Normocephalic.  Pulmonary:     Effort: Pulmonary effort is normal.  Neurological:     Mental Status: He is alert and oriented to person, place, and time.  Psychiatric:        Mood and Affect: Mood normal.        Behavior: Behavior normal.         Assessment & Plan:    Problem List Items Addressed This Visit       Cardiovascular and Mediastinum   Hypertension - Primary   Relevant Medications   losartan  (COZAAR ) 50 MG tablet   Other Relevant Orders   Basic Metabolic Panel (BMET)   Other Visit Diagnoses       Neck pain on left side       Relevant Orders   DG Cervical Spine Complete     Grief reaction           Assessment and Plan Assessment & Plan Primary hypertension Blood pressure elevated, max 161/93 mmHg. Stress and lack of sleep may contribute. Current losartan  dose insufficient. -  Increased losartan  to 50 mg once daily. - Ordered kidney function test in 10-14 days to monitor effects on kidney function and potassium. - Advised home blood pressure monitoring and report if no improvement in two weeks.  Cervicalgia Intermittent pain behind left ear to skull base, partially relieved by Tylenol . Likely musculoskeletal, possibly stress-related. - Ordered neck x-ray. - Advised heating pad and range of motion exercises.  Grief reaction Grief following wife's death, contributing to elevated blood pressure and poor sleep. Managing with family support and work. - Encouraged contacting hospice for grief counseling. - Discussed benefits of grief counseling and offered support in arranging it.    Meds ordered this encounter  Medications   losartan  (COZAAR ) 50 MG tablet    Sig: Take 1 tablet (50  mg total) by mouth daily.    Dispense:  90 tablet    Refill:  1     No follow-ups on file.    Keep f/u in March.   I discussed the assessment and treatment plan with the patient. The patient was provided an opportunity to ask questions and all were answered. The patient agreed with the plan and demonstrated an understanding of the instructions.   The patient was advised to call back or seek an in-person evaluation if the symptoms worsen or if the condition fails to improve as anticipated.  I provided 20 minutes of non-face-to-face time during this encounter.  Dorothyann Byars, MD [facility]    "

## 2024-12-18 ENCOUNTER — Ambulatory Visit: Admitting: Family Medicine

## 2024-12-23 ENCOUNTER — Ambulatory Visit
